# Patient Record
Sex: Male | Born: 1990 | Race: White | Hispanic: No | Marital: Single | Smoking: Current every day smoker
Health system: Southern US, Community
[De-identification: ages and names within clinical notes are randomized; demographics above are authoritative.]

## PROBLEM LIST (undated history)

## (undated) DIAGNOSIS — I10 Essential (primary) hypertension: Secondary | ICD-10-CM

---
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
## 2013-04-24 ENCOUNTER — Emergency Department (HOSPITAL_COMMUNITY): Payer: Self-pay

## 2013-04-24 ENCOUNTER — Emergency Department (HOSPITAL_COMMUNITY)
Admission: EM | Admit: 2013-04-24 | Discharge: 2013-04-24 | Disposition: A | Discharge status: Home / Self Care | Payer: Self-pay | Attending: Emergency Medicine | Admitting: Emergency Medicine

## 2013-04-24 DIAGNOSIS — Z23 Encounter for immunization: Secondary | ICD-10-CM | POA: Insufficient documentation

## 2013-04-24 DIAGNOSIS — S0990XA Unspecified injury of head, initial encounter: Secondary | ICD-10-CM | POA: Insufficient documentation

## 2013-04-24 DIAGNOSIS — S0100XA Unspecified open wound of scalp, initial encounter: Secondary | ICD-10-CM | POA: Insufficient documentation

## 2013-04-24 DIAGNOSIS — S0101XA Laceration without foreign body of scalp, initial encounter: Secondary | ICD-10-CM

## 2013-04-24 LAB — BASIC METABOLIC PANEL
BUN: 13 mg/dL (ref 6–23)
Creatinine, Ser: 1.02 mg/dL (ref 0.50–1.35)
GFR calc Af Amer: >90 mL/min (ref >90)
GFR calc non Af Amer: >90 mL/min (ref >90)
Glucose, Bld: 92 mg/dL (ref 70–99)
Potassium: 3.4 mEq/L — ABNORMAL LOW (ref 3.5–5.1)

## 2013-04-24 LAB — CBC WITH DIFFERENTIAL/PLATELET
Basophils Relative: 1 % (ref 0–1)
Eosinophils Absolute: 0.1 10*3/uL (ref 0.0–0.7)
Eosinophils Relative: 1 % (ref 0–5)
MCH: 29.5 pg (ref 26.0–34.0)
MCHC: 35.4 g/dL (ref 30.0–36.0)
MCV: 83.2 fL (ref 78.0–100.0)
Neutrophils Relative %: 68 % (ref 43–77)
Platelets: 254 10*3/uL (ref 150–400)
RBC: 4.95 MIL/uL (ref 4.22–5.81)
RDW: 12.5 % (ref 11.5–15.5)

## 2013-04-24 LAB — TYPE AND SCREEN
ABO/RH(D): O POS
Antibody Screen: NEGATIVE

## 2013-04-24 MED ORDER — TETANUS-DIPHTH-ACELL PERTUSSIS 5-2.5-18.5 LF-MCG/0.5 IM SUSP
0.5000 mL | Freq: Once | INTRAMUSCULAR | Status: AC
  Administered 2013-04-24: 0.5 mL via INTRAMUSCULAR

## 2013-04-24 MED ORDER — TETANUS-DIPHTHERIA TOXOIDS TD 5-2 LFU IM INJ
0.5000 mL | INJECTION | Freq: Once | INTRAMUSCULAR | Status: DC
  Filled 2013-04-24: qty 0.5

## 2013-04-24 MED ORDER — SODIUM CHLORIDE 0.9 % IV SOLN
1000.0000 mL | Freq: Once | INTRAVENOUS | Status: AC
  Administered 2013-04-24: 1000 mL via INTRAVENOUS
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
## 2013-04-24 NOTE — ED Notes (Signed)
MD at bedside. 
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
## 2013-05-02 ENCOUNTER — Emergency Department (HOSPITAL_COMMUNITY)
Admission: EM | Admit: 2013-05-02 | Discharge: 2013-05-02 | Disposition: A | Discharge status: Home / Self Care | Payer: Self-pay | Attending: Emergency Medicine | Admitting: Emergency Medicine

## 2013-05-02 ENCOUNTER — Encounter (HOSPITAL_COMMUNITY): Payer: Self-pay | Admitting: *Deleted

## 2013-05-02 DIAGNOSIS — R51 Headache: Secondary | ICD-10-CM | POA: Insufficient documentation

## 2013-05-02 DIAGNOSIS — R110 Nausea: Secondary | ICD-10-CM | POA: Insufficient documentation

## 2013-05-02 DIAGNOSIS — F0781 Postconcussional syndrome: Secondary | ICD-10-CM | POA: Insufficient documentation

## 2013-05-02 DIAGNOSIS — IMO0001 Reserved for inherently not codable concepts without codable children: Secondary | ICD-10-CM | POA: Insufficient documentation

## 2013-05-02 DIAGNOSIS — R42 Dizziness and giddiness: Secondary | ICD-10-CM | POA: Insufficient documentation

## 2013-05-02 DIAGNOSIS — R109 Unspecified abdominal pain: Secondary | ICD-10-CM | POA: Insufficient documentation

## 2013-05-02 DIAGNOSIS — Z202 Contact with and (suspected) exposure to infections with a predominantly sexual mode of transmission: Secondary | ICD-10-CM | POA: Insufficient documentation

## 2013-05-02 DIAGNOSIS — F17200 Nicotine dependence, unspecified, uncomplicated: Secondary | ICD-10-CM | POA: Insufficient documentation

## 2013-05-02 MED ORDER — CEFTRIAXONE SODIUM 250 MG IJ SOLR
250.0000 mg | Freq: Once | INTRAMUSCULAR | Status: AC
  Administered 2013-05-02: 250 mg via INTRAMUSCULAR
  Filled 2013-05-02: qty 250

## 2013-05-02 MED ORDER — AZITHROMYCIN 250 MG PO TABS
1000.0000 mg | ORAL_TABLET | Freq: Once | ORAL | Status: AC
  Administered 2013-05-02: 1000 mg via ORAL
  Filled 2013-05-02: qty 4

## 2013-05-02 MED ORDER — IBUPROFEN 400 MG PO TABS
800.0000 mg | ORAL_TABLET | Freq: Once | ORAL | Status: AC
  Administered 2013-05-02: 800 mg via ORAL
  Filled 2013-05-02: qty 2

## 2013-05-02 MED ORDER — LIDOCAINE HCL (PF) 1 % IJ SOLN
INTRAMUSCULAR | Status: AC
  Administered 2013-05-02: 2.1 mL
  Filled 2013-05-02: qty 5

## 2013-05-02 MED ORDER — IBUPROFEN 600 MG PO TABS: 600.0000 mg | ORAL_TABLET | Freq: Four times a day (QID) | ORAL | Status: DC | PRN
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
## 2013-05-03 NOTE — ED Provider Notes (Signed)
Medical screening examination/treatment/procedure(s) were performed by non-physician practitioner and as supervising physician I was immediately available for consultation/collaboration.  Cristina Mattern L Shaquanna Lycan, MD 05/03/13 0016 

## 2015-08-14 ENCOUNTER — Encounter (HOSPITAL_COMMUNITY): Payer: Self-pay | Admitting: Emergency Medicine

## 2015-08-14 DIAGNOSIS — L02414 Cutaneous abscess of left upper limb: Secondary | ICD-10-CM | POA: Insufficient documentation

## 2015-08-14 DIAGNOSIS — F17200 Nicotine dependence, unspecified, uncomplicated: Secondary | ICD-10-CM | POA: Insufficient documentation

## 2015-08-14 MED ORDER — OXYCODONE-ACETAMINOPHEN 5-325 MG PO TABS
ORAL_TABLET | ORAL | Status: AC
  Filled 2015-08-14: qty 1

## 2015-08-14 MED ORDER — OXYCODONE-ACETAMINOPHEN 5-325 MG PO TABS
1.0000 | ORAL_TABLET | Freq: Once | ORAL | Status: AC
  Administered 2015-08-14: 1 via ORAL

## 2015-08-14 NOTE — ED Notes (Addendum)
C/o abscess to L shoulder x 2 days with greenish- white discharge.  Pt reports ? Spider bite.  Also reports he felt like he had a fever yesterday.

## 2015-08-15 ENCOUNTER — Emergency Department (HOSPITAL_COMMUNITY)
Admission: EM | Admit: 2015-08-15 | Discharge: 2015-08-15 | Disposition: A | Discharge status: Home / Self Care | Payer: Self-pay | Attending: Emergency Medicine | Admitting: Emergency Medicine

## 2015-08-15 DIAGNOSIS — L0291 Cutaneous abscess, unspecified: Secondary | ICD-10-CM

## 2015-08-15 MED ORDER — LIDOCAINE HCL (PF) 1 % IJ SOLN: 2.0000 mL | Freq: Once | INTRAMUSCULAR | Status: DC

## 2015-08-15 MED ORDER — IBUPROFEN 600 MG PO TABS: 600.0000 mg | ORAL_TABLET | Freq: Four times a day (QID) | ORAL | Status: DC | PRN

## 2015-08-15 MED ORDER — SULFAMETHOXAZOLE-TRIMETHOPRIM 800-160 MG PO TABS: 1.0000 | ORAL_TABLET | Freq: Two times a day (BID) | ORAL | Status: AC

## 2015-08-15 MED ORDER — HYDROCODONE-ACETAMINOPHEN 5-325 MG PO TABS: 1.0000 | ORAL_TABLET | Freq: Four times a day (QID) | ORAL | Status: DC | PRN

## 2015-08-15 NOTE — ED Provider Notes (Addendum)
CSN: [id number]     Arrival date & time 08/14/15  2119 History   First MD Initiated Contact with Patient 08/15/15 0008     Chief Complaint  Patient presents with  . Abscess     (Consider location/radiation/quality/duration/timing/severity/associated sxs/prior Treatment) HPI Comments: Abscess L shoulder for the past 3 days that spontaneously open and started draining 2 days ago has been intermittently takin BC powers for pain. Now states has pain with arm movement .   Patient is a 24 y.o. male presenting with abscess. The history is provided by the patient.  Abscess Location:  Shoulder/arm Shoulder/arm abscess location:  L shoulder Abscess quality: draining, painful and redness   Abscess quality: no fluctuance and not weeping   Red streaking: no   Duration:  3 days Progression:  Unchanged Pain details:    Quality:  Pressure   Severity:  Moderate   Duration:  3 days   Timing:  Constant   Progression:  Unchanged Chronicity:  New Relieved by:  None tried Exacerbated by: palpation  Ineffective treatments:  None tried Associated symptoms: no fever     History reviewed. No pertinent past medical history. History reviewed. No pertinent past surgical history. No family history on file. Social History  Substance Use Topics  . Smoking status: Current Every Day Smoker  . Smokeless tobacco: None  . Alcohol Use: No    Review of Systems  Constitutional: Negative for fever and chills.  Skin: Positive for wound.  Neurological: Negative for numbness.  All other systems reviewed and are negative.     Allergies  Shrimp and Shrimp  Home Medications   Prior to Admission medications   Medication Sig Start Date End Date Taking? Authorizing Provider  HYDROcodone-acetaminophen (NORCO/VICODIN) 5-325 MG tablet Take 1 tablet by mouth every 6 (six) hours as needed for severe pain. 08/15/15   Earley FavorGail Luevenia Mcavoy, NP  ibuprofen (ADVIL,MOTRIN) 600 MG tablet Take 1 tablet (600 mg total) by mouth  every 6 (six) hours as needed. 08/15/15   Earley FavorGail Ben Habermann, NP   BP 134/73 mmHg  Pulse 85  Temp(Src) 98.7 F (37.1 C) (Oral)  Resp 16  Ht 5\' 7"  (1.702 m)  Wt 78.926 kg  BMI 27.25 kg/m2  SpO2 100% Physical Exam  Constitutional: He appears well-developed and well-nourished.  HENT:  Head: Normocephalic.  Eyes: Pupils are equal, round, and reactive to light.  Neck: Normal range of motion.  Cardiovascular: Normal rate.   Pulmonary/Chest: Effort normal.  Abdominal: Soft.  Musculoskeletal: Normal range of motion. He exhibits tenderness. He exhibits no edema.  Lymphadenopathy:    He has axillary adenopathy.       Left axillary: Pectoral adenopathy present.  Neurological: He is alert.  Skin: There is erythema.     Nursing note and vitals reviewed.   ED Course  Procedures (including critical care time) Labs Review Labs Reviewed - No data to display  Imaging Review No results found. I have personally reviewed and evaluated these images and lab results as part of my medical decision-making.   EKG Interpretation None     Will start PO antibiotics, warm compresses and pain control  Abscess not drained MDM   Final diagnoses:  Abscess         Earley FavorGail Lakiah Dhingra, NP 09/03/15 1958  Earley FavorGail Areg Bialas, NP 09/03/15 2159  Lyndal Pulleyaniel Knott, MD 09/04/15 0701  Earley FavorGail Jaziel Bennett, NP 11/25/15 2017  Lyndal Pulleyaniel Knott, MD 11/26/15 [phone]

## 2015-08-15 NOTE — Discharge Instructions (Signed)
Abscess An abscess (boil or furuncle) is an infected area on or under the skin. This area is filled with yellowish-white fluid (pus) and other material (debris). HOME CARE   Only take medicines as told by your doctor.  If you were given antibiotic medicine, take it as directed. Finish the medicine even if you start to feel better.  If gauze is used, follow your doctor's directions for changing the gauze.  To avoid spreading the infection:  Keep your abscess covered with a bandage.  Wash your hands well.  Do not share personal care items, towels, or whirlpools with others.  Avoid skin contact with others.  Keep your skin and clothes clean around the abscess.  Keep all doctor visits as told. GET HELP RIGHT AWAY IF:   You have more pain, puffiness (swelling), or redness in the wound site.  You have more fluid or blood coming from the wound site.  You have muscle aches, chills, or you feel sick.  You have a fever. MAKE SURE YOU:   Understand these instructions.  Will watch your condition.  Will get help right away if you are not doing well or get worse.   This information is not intended to replace advice given to you by your health care provider. Make sure you discuss any questions you have with your health care provider.   Document Released: 03/19/2008 Document Revised: 04/01/2012 Document Reviewed: 12/15/2011 Elsevier Interactive Patient Education 2016 [employer] Take the antibiotic as directed until all tablet taken use the Ibuprofen for moderate pain and Hydrocodone for sever pain

## 2015-12-12 ENCOUNTER — Emergency Department (HOSPITAL_COMMUNITY)
Admission: EM | Admit: 2015-12-12 | Discharge: 2015-12-13 | Disposition: A | Discharge status: Home / Self Care | Payer: Medicaid Other | Attending: Emergency Medicine | Admitting: Emergency Medicine

## 2015-12-12 ENCOUNTER — Encounter (HOSPITAL_COMMUNITY): Payer: Self-pay | Admitting: *Deleted

## 2015-12-12 DIAGNOSIS — L02416 Cutaneous abscess of left lower limb: Secondary | ICD-10-CM | POA: Diagnosis not present

## 2015-12-12 DIAGNOSIS — I10 Essential (primary) hypertension: Secondary | ICD-10-CM | POA: Insufficient documentation

## 2015-12-12 DIAGNOSIS — L03116 Cellulitis of left lower limb: Secondary | ICD-10-CM | POA: Diagnosis not present

## 2015-12-12 DIAGNOSIS — F17210 Nicotine dependence, cigarettes, uncomplicated: Secondary | ICD-10-CM | POA: Insufficient documentation

## 2015-12-12 HISTORY — DX: Essential (primary) hypertension: I10

## 2015-12-12 LAB — CBC WITH DIFFERENTIAL/PLATELET
Basophils Absolute: 0 K/uL (ref 0.0–0.1)
Basophils Relative: 0 %
Eosinophils Absolute: 0.2 K/uL (ref 0.0–0.7)
Eosinophils Relative: 2 %
HCT: 45.2 % (ref 39.0–52.0)
Hemoglobin: 15.8 g/dL (ref 13.0–17.0)
Lymphocytes Relative: 18 %
Lymphs Abs: 2.1 K/uL (ref 0.7–4.0)
MCH: 28.6 pg (ref 26.0–34.0)
MCHC: 35 g/dL (ref 30.0–36.0)
MCV: 81.7 fL (ref 78.0–100.0)
Monocytes Absolute: 0.5 K/uL (ref 0.1–1.0)
Monocytes Relative: 4 %
Neutro Abs: 8.8 K/uL — ABNORMAL HIGH (ref 1.7–7.7)
Neutrophils Relative %: 76 %
Platelets: 288 K/uL (ref 150–400)
RBC: 5.53 MIL/uL (ref 4.22–5.81)
RDW: 12.6 % (ref 11.5–15.5)
WBC: 11.6 K/uL — ABNORMAL HIGH (ref 4.0–10.5)

## 2015-12-12 LAB — COMPREHENSIVE METABOLIC PANEL WITH GFR
ALT: 23 U/L (ref 17–63)
AST: 23 U/L (ref 15–41)
Albumin: 4.1 g/dL (ref 3.5–5.0)
Alkaline Phosphatase: 82 U/L (ref 38–126)
Anion gap: 16 — ABNORMAL HIGH (ref 5–15)
BUN: 17 mg/dL (ref 6–20)
CO2: 22 mmol/L (ref 22–32)
Calcium: 9.5 mg/dL (ref 8.9–10.3)
Chloride: 104 mmol/L (ref 101–111)
Creatinine, Ser: 1.35 mg/dL — ABNORMAL HIGH (ref 0.61–1.24)
GFR calc Af Amer: >60 mL/min (ref >60)
GFR calc non Af Amer: >60 mL/min (ref >60)
Glucose, Bld: 103 mg/dL — ABNORMAL HIGH (ref 65–99)
Potassium: 4.1 mmol/L (ref 3.5–5.1)
Sodium: 142 mmol/L (ref 135–145)
Total Bilirubin: 0.4 mg/dL (ref 0.3–1.2)
Total Protein: 7.4 g/dL (ref 6.5–8.1)

## 2015-12-12 LAB — LACTIC ACID, PLASMA: Lactic Acid, Venous: 0.7 mmol/L (ref 0.5–2.0)

## 2015-12-12 MED ORDER — KETOROLAC TROMETHAMINE 30 MG/ML IJ SOLN
30.0000 mg | Freq: Once | INTRAMUSCULAR | Status: AC
  Administered 2015-12-12: 30 mg via INTRAVENOUS
  Filled 2015-12-12: qty 1

## 2015-12-12 MED ORDER — OXYCODONE-ACETAMINOPHEN 5-325 MG PO TABS
1.0000 | ORAL_TABLET | Freq: Once | ORAL | Status: AC
  Administered 2015-12-12: 1 via ORAL

## 2015-12-12 MED ORDER — HYDROCODONE-ACETAMINOPHEN 5-325 MG PO TABS: 1.0000 | ORAL_TABLET | ORAL | Status: DC | PRN

## 2015-12-12 MED ORDER — CLINDAMYCIN HCL 150 MG PO CAPS
300.0000 mg | ORAL_CAPSULE | Freq: Three times a day (TID) | ORAL | Status: DC
Start: 2015-12-12 — End: 2016-11-16

## 2015-12-12 MED ORDER — CLINDAMYCIN PHOSPHATE 900 MG/50ML IV SOLN
900.0000 mg | Freq: Once | INTRAVENOUS | Status: AC
  Administered 2015-12-12: 900 mg via INTRAVENOUS
  Filled 2015-12-12: qty 50

## 2015-12-12 MED ORDER — MORPHINE SULFATE (PF) 4 MG/ML IV SOLN
4.0000 mg | Freq: Once | INTRAVENOUS | Status: AC
  Administered 2015-12-12: 4 mg via INTRAVENOUS
  Filled 2015-12-12: qty 1

## 2015-12-12 MED ORDER — LIDOCAINE-EPINEPHRINE (PF) 2 %-1:200000 IJ SOLN
10.0000 mL | Freq: Once | INTRAMUSCULAR | Status: AC
  Administered 2015-12-12: 10 mL
  Filled 2015-12-12: qty 10

## 2015-12-12 MED ORDER — OXYCODONE-ACETAMINOPHEN 5-325 MG PO TABS
ORAL_TABLET | ORAL | Status: AC
  Filled 2015-12-12: qty 1

## 2015-12-12 MED ORDER — SODIUM CHLORIDE 0.9 % IV BOLUS (SEPSIS)
1000.0000 mL | Freq: Once | INTRAVENOUS | Status: AC
  Administered 2015-12-12: 1000 mL via INTRAVENOUS

## 2015-12-12 NOTE — Discharge Instructions (Signed)
Abscess °An abscess is an infected area that contains a collection of pus and debris. It can occur in almost any part of the body. An abscess is also known as a furuncle or boil. °CAUSES  °An abscess occurs when tissue gets infected. This can occur from blockage of oil or sweat glands, infection of hair follicles, or a minor injury to the skin. As the body tries to fight the infection, pus collects in the area and creates pressure under the skin. This pressure causes pain. People with weakened immune systems have difficulty fighting infections and get certain abscesses more often.  °SYMPTOMS °Usually an abscess develops on the skin and becomes a painful mass that is red, warm, and tender. If the abscess forms under the skin, you may feel a moveable soft area under the skin. Some abscesses break open (rupture) on their own, but most will continue to get worse without care. The infection can spread deeper into the body and eventually into the bloodstream, causing you to feel ill.  °DIAGNOSIS  °Your caregiver will take your medical history and perform a physical exam. A sample of fluid may also be taken from the abscess to determine what is causing your infection. °TREATMENT  °Your caregiver may prescribe antibiotic medicines to fight the infection. However, taking antibiotics alone usually does not cure an abscess. Your caregiver may need to make a small cut (incision) in the abscess to drain the pus. In some cases, gauze is packed into the abscess to reduce pain and to continue draining the area. °HOME CARE INSTRUCTIONS  °· Only take over-the-counter or prescription medicines for pain, discomfort, or fever as directed by your caregiver. °· If you were prescribed antibiotics, take them as directed. Finish them even if you start to feel better. °· If gauze is used, follow your caregiver's directions for changing the gauze. °· To avoid spreading the infection: °· Keep your draining abscess covered with a  bandage. °· Wash your hands well. °· Do not share personal care items, towels, or whirlpools with others. °· Avoid skin contact with others. °· Keep your skin and clothes clean around the abscess. °· Keep all follow-up appointments as directed by your caregiver. °SEEK MEDICAL CARE IF:  °· You have increased pain, swelling, redness, fluid drainage, or bleeding. °· You have muscle aches, chills, or a general ill feeling. °· You have a fever. °MAKE SURE YOU:  °· Understand these instructions. °· Will watch your condition. °· Will get help right away if you are not doing well or get worse. °  °This information is not intended to replace advice given to you by your health care provider. Make sure you discuss any questions you have with your health care provider. °  °Document Released: 07/11/2005 Document Revised: 04/01/2012 Document Reviewed: 12/14/2011 °Elsevier Interactive Patient Education ©2016 [employer] ° °Cellulitis °Cellulitis is an infection of the skin and the tissue beneath it. The infected area is usually red and tender. Cellulitis occurs most often in the arms and lower legs.  °CAUSES  °Cellulitis is caused by bacteria that enter the skin through cracks or cuts in the skin. The most common types of bacteria that cause cellulitis are staphylococci and streptococci. °SIGNS AND SYMPTOMS  °· Redness and warmth. °· Swelling. °· Tenderness or pain. °· Fever. °DIAGNOSIS  °Your health care provider can usually determine what is wrong based on a physical exam. Blood tests may also be done. °TREATMENT  °Treatment usually involves taking an antibiotic medicine. °HOME CARE INSTRUCTIONS  °·   Take your antibiotic medicine as directed by your health care provider. Finish the antibiotic even if you start to feel better. °· Keep the infected arm or leg elevated to reduce swelling. °· Apply a warm cloth to the affected area up to 4 times per day to relieve pain. °· Take medicines only as directed by your health care  provider. °· Keep all follow-up visits as directed by your health care provider. °SEEK MEDICAL CARE IF:  °· You notice red streaks coming from the infected area. °· Your red area gets larger or turns dark in color. °· Your bone or joint underneath the infected area becomes painful after the skin has healed. °· Your infection returns in the same area or another area. °· You notice a swollen bump in the infected area. °· You develop new symptoms. °· You have a fever. °SEEK IMMEDIATE MEDICAL CARE IF:  °· You feel very sleepy. °· You develop vomiting or diarrhea. °· You have a general ill feeling (malaise) with muscle aches and pains. °  °This information is not intended to replace advice given to you by your health care provider. Make sure you discuss any questions you have with your health care provider. °  °Document Released: 07/11/2005 Document Revised: 06/22/2015 Document Reviewed: 12/17/2011 °Elsevier Interactive Patient Education ©2016 [employer] ° °Incision and Drainage °Incision and drainage is a procedure in which a sac-like structure (cystic structure) is opened and drained. The area to be drained usually contains material such as pus, fluid, or blood.  °LET YOUR CAREGIVER KNOW ABOUT:  °· Allergies to medicine. °· Medicines taken, including vitamins, herbs, eyedrops, over-the-counter medicines, and creams. °· Use of steroids (by mouth or creams). °· Previous problems with anesthetics or numbing medicines. °· History of bleeding problems or blood clots. °· Previous surgery. °· Other health problems, including diabetes and kidney problems. °· Possibility of pregnancy, if this applies. °RISKS AND COMPLICATIONS °· Pain. °· Bleeding. °· Scarring. °· Infection. °BEFORE THE PROCEDURE  °You may need to have an ultrasound or other imaging tests to see how large or deep your cystic structure is. Blood tests may also be used to determine if you have an infection or how severe the infection is. You may need to have a  tetanus shot. °PROCEDURE  °The affected area is cleaned with a cleaning fluid. The cyst area will then be numbed with a medicine (local anesthetic). A small incision will be made in the cystic structure. A syringe or catheter may be used to drain the contents of the cystic structure, or the contents may be squeezed out. The area will then be flushed with a cleansing solution. After cleansing the area, it is often gently packed with a gauze or another wound dressing. Once it is packed, it will be covered with gauze and tape or some other type of wound dressing.  °AFTER THE PROCEDURE  °· Often, you will be allowed to go home right after the procedure. °· You may be given antibiotic medicine to prevent or heal an infection. °· If the area was packed with gauze or some other wound dressing, you will likely need to come back in 1 to 2 days to get it removed. °· The area should heal in about 14 days. °  °This information is not intended to replace advice given to you by your health care provider. Make sure you discuss any questions you have with your health care provider. °  °Document Released: 03/27/2001 Document Revised: 04/01/2012 Document Reviewed: 11/26/2011 °  Elsevier Interactive Patient Education ©2016 [employer] Inc. ° °

## 2015-12-12 NOTE — ED Provider Notes (Signed)
CSN: [id number]     Arrival date & time 12/12/15  1725 History   First MD Initiated Contact with Patient 12/12/15 2034     Chief Complaint  Patient presents with  . Abscess     (Consider location/radiation/quality/duration/timing/severity/associated sxs/prior Treatment) Patient is a 25 y.o. male presenting with abscess. The history is provided by the patient and the spouse. No language interpreter was used.  Abscess Location:  Leg Leg abscess location:  L lower leg Abscess quality: itching, painful, redness and warmth   Abscess quality: not draining   Red streaking: yes   Duration:  5 days Progression:  Worsening Associated symptoms: no fever, no nausea and no vomiting   Associated symptoms comment:  Patient with a history of boils presents with progressive swelling and pain in left anterior lower leg, with redness now extending to distal portion of leg. He reports generalized body aches x 2 days. No known fever.    Past Medical History  Diagnosis Date  . Hypertension    History reviewed. No pertinent past surgical history. No family history on file. Social History  Substance Use Topics  . Smoking status: Current Every Day Smoker -- 0.50 packs/day    Types: Cigarettes  . Smokeless tobacco: None  . Alcohol Use: No    Review of Systems  Constitutional: Positive for chills. Negative for fever.  Respiratory: Negative for cough and shortness of breath.   Gastrointestinal: Negative for nausea and vomiting.  Musculoskeletal: Positive for myalgias.       See HPI.  Skin: Positive for color change and wound.  Neurological: Negative for numbness.      Allergies  Shrimp  Home Medications   Prior to Admission medications   Medication Sig Start Date End Date Taking? Authorizing Provider  clindamycin (CLEOCIN) 150 MG capsule Take 2 capsules (300 mg total) by mouth 3 (three) times daily. 12/12/15   Elpidio Anis, PA-C  HYDROcodone-acetaminophen (NORCO/VICODIN) 5-325 MG  tablet Take 1-2 tablets by mouth every 4 (four) hours as needed. 12/12/15   Elpidio Anis, PA-C  ibuprofen (ADVIL,MOTRIN) 600 MG tablet Take 1 tablet (600 mg total) by mouth every 6 (six) hours as needed. Patient not taking: Reported on 12/12/2015 08/15/15   Earley Favor, NP   BP 139/88 mmHg  Pulse 93  Temp(Src) 99.1 F (37.3 C) (Oral)  Resp 16  Ht  (1.651 m)  Wt 77.111 kg  BMI 28.29 kg/m2  SpO2 99% Physical Exam  Constitutional: He is oriented to person, place, and time. He appears well-developed and well-nourished. No distress.  Cardiovascular: Intact distal pulses.   Pulmonary/Chest: Effort normal.  Musculoskeletal:  Left anterior lower leg erythematous, warm to touch. There is a large raised significantly red area to proximal lower leg c/w abscess with central ulceration but no active drainage. No redness that extends proximally. Left lower leg moderately swollen.   Neurological: He is alert and oriented to person, place, and time.  Skin: There is erythema.  Psychiatric: He has a normal mood and affect.    ED Course  Procedures (including critical care time) Labs Review Labs Reviewed  COMPREHENSIVE METABOLIC PANEL - Abnormal; Notable for the following:    Glucose, Bld 103 (*)    Creatinine, Ser 1.35 (*)    Anion gap 16 (*)    All other components within normal limits  CBC WITH DIFFERENTIAL/PLATELET - Abnormal; Notable for the following:    WBC 11.6 (*)    Neutro Abs 8.8 (*)    All  other components within normal limits  LACTIC ACID, PLASMA  LACTIC ACID, PLASMA   Results for orders placed or performed during the hospital encounter of 12/12/15  Comprehensive metabolic panel  Result Value Ref Range   Sodium 142 135 - 145 mmol/L   Potassium 4.1 3.5 - 5.1 mmol/L   Chloride 104 101 - 111 mmol/L   CO2 22 22 - 32 mmol/L   Glucose, Bld 103 (H) 65 - 99 mg/dL   BUN 17 6 - 20 mg/dL   Creatinine, Ser 4.54 (H) 0.61 - 1.24 mg/dL   Calcium 9.5 8.9 - 09.8 mg/dL   Total Protein  7.4 6.5 - 8.1 g/dL   Albumin 4.1 3.5 - 5.0 g/dL   AST 23 15 - 41 U/L   ALT 23 17 - 63 U/L   Alkaline Phosphatase 82 38 - 126 U/L   Total Bilirubin 0.4 0.3 - 1.2 mg/dL   GFR calc non Af Amer >60 >60 mL/min   GFR calc Af Amer >60 >60 mL/min   Anion gap 16 (H) 5 - 15  CBC with Differential  Result Value Ref Range   WBC 11.6 (H) 4.0 - 10.5 K/uL   RBC 5.53 4.22 - 5.81 MIL/uL   Hemoglobin 15.8 13.0 - 17.0 g/dL   HCT 11.9 14.7 - 82.9 %   MCV 81.7 78.0 - 100.0 fL   MCH 28.6 26.0 - 34.0 pg   MCHC 35.0 30.0 - 36.0 g/dL   RDW 56.2 13.0 - 86.5 %   Platelets 288 150 - 400 K/uL   Neutrophils Relative % 76 %   Neutro Abs 8.8 (H) 1.7 - 7.7 K/uL   Lymphocytes Relative 18 %   Lymphs Abs 2.1 0.7 - 4.0 K/uL   Monocytes Relative 4 %   Monocytes Absolute 0.5 0.1 - 1.0 K/uL   Eosinophils Relative 2 %   Eosinophils Absolute 0.2 0.0 - 0.7 K/uL   Basophils Relative 0 %   Basophils Absolute 0.0 0.0 - 0.1 K/uL  Lactic acid, plasma  Result Value Ref Range   Lactic Acid, Venous 0.7 0.5 - 2.0 mmol/L   INCISION AND DRAINAGE Performed by: Elpidio Anis A Consent: Verbal consent obtained. Risks and benefits: risks, benefits and alternatives were discussed Type: abscess  Body area: left lower leg, anterior  Anesthesia: local infiltration  Incision was made with a scalpel.  Local anesthetic: lidocaine 2% w/epinephrine  Anesthetic total: 3 ml  Complexity: complex Blunt dissection to break up loculations  Drainage: purulent  Drainage amount: moderate  Packing material: none  Patient tolerance: Patient tolerated the procedure well with no immediate complications.     Imaging Review No results found. I have personally reviewed and evaluated these images and lab results as part of my medical decision-making.   EKG Interpretation None      MDM   Final diagnoses:  Abscess of left leg  Cellulitis of left anterior lower leg    1. Left leg abscess 2. Left leg cellulitis  Patient  with abscess on left leg x 5 days, now with swelling, redness, body aches prompting ED visit. Lab studies reassuring. Lactic Acid normal. He received IV clinda and will be able to discharge home with PO Clinda. Encouraged 2 day recheck here as he has no primary care provider.     Elpidio Anis, PA-C 12/12/15 7846  Pricilla Loveless, MD 12/13/15 [phone]

## 2015-12-12 NOTE — ED Notes (Addendum)
Pt has abscess to L shin x 6 days.  States now pain travelling to L thigh.  C/o whole body aches and fevers at home.  No redness noted to L thigh, though tenderness on palpation.

## 2016-11-16 ENCOUNTER — Emergency Department (HOSPITAL_COMMUNITY)
Admission: EM | Admit: 2016-11-16 | Discharge: 2016-11-16 | Disposition: A | Discharge status: Home / Self Care | Payer: Medicaid Other | Attending: Emergency Medicine | Admitting: Emergency Medicine

## 2016-11-16 ENCOUNTER — Encounter (HOSPITAL_COMMUNITY): Payer: Self-pay | Admitting: Emergency Medicine

## 2016-11-16 DIAGNOSIS — Y93G9 Activity, other involving cooking and grilling: Secondary | ICD-10-CM | POA: Insufficient documentation

## 2016-11-16 DIAGNOSIS — Y929 Unspecified place or not applicable: Secondary | ICD-10-CM | POA: Insufficient documentation

## 2016-11-16 DIAGNOSIS — X102XXA Contact with fats and cooking oils, initial encounter: Secondary | ICD-10-CM | POA: Insufficient documentation

## 2016-11-16 DIAGNOSIS — Y999 Unspecified external cause status: Secondary | ICD-10-CM | POA: Insufficient documentation

## 2016-11-16 DIAGNOSIS — T25221A Burn of second degree of right foot, initial encounter: Secondary | ICD-10-CM | POA: Diagnosis not present

## 2016-11-16 DIAGNOSIS — Z23 Encounter for immunization: Secondary | ICD-10-CM | POA: Diagnosis not present

## 2016-11-16 DIAGNOSIS — I10 Essential (primary) hypertension: Secondary | ICD-10-CM | POA: Insufficient documentation

## 2016-11-16 DIAGNOSIS — F17210 Nicotine dependence, cigarettes, uncomplicated: Secondary | ICD-10-CM | POA: Diagnosis not present

## 2016-11-16 DIAGNOSIS — Z79899 Other long term (current) drug therapy: Secondary | ICD-10-CM | POA: Insufficient documentation

## 2016-11-16 DIAGNOSIS — T25021A Burn of unspecified degree of right foot, initial encounter: Secondary | ICD-10-CM | POA: Diagnosis present

## 2016-11-16 MED ORDER — TETANUS-DIPHTH-ACELL PERTUSSIS 5-2.5-18.5 LF-MCG/0.5 IM SUSP
0.5000 mL | Freq: Once | INTRAMUSCULAR | Status: AC
  Administered 2016-11-16: 0.5 mL via INTRAMUSCULAR
  Filled 2016-11-16: qty 0.5

## 2016-11-16 MED ORDER — CEPHALEXIN 500 MG PO CAPS: 1000.0000 mg | ORAL_CAPSULE | Freq: Two times a day (BID) | ORAL | 0 refills | Status: DC

## 2016-11-16 MED ORDER — OXYCODONE-ACETAMINOPHEN 5-325 MG PO TABS
ORAL_TABLET | ORAL | Status: AC
  Administered 2016-11-16: 1 via ORAL
  Filled 2016-11-16: qty 1

## 2016-11-16 MED ORDER — OXYCODONE-ACETAMINOPHEN 5-325 MG PO TABS
1.0000 | ORAL_TABLET | ORAL | Status: AC | PRN
  Administered 2016-11-16 (×2): 1 via ORAL
  Filled 2016-11-16 (×2): qty 1

## 2016-11-16 MED ORDER — CEPHALEXIN 250 MG PO CAPS
1000.0000 mg | ORAL_CAPSULE | Freq: Once | ORAL | Status: AC
  Administered 2016-11-16: 1000 mg via ORAL
  Filled 2016-11-16: qty 4

## 2016-11-16 MED ORDER — OXYCODONE-ACETAMINOPHEN 5-325 MG PO TABS: 1.0000 | ORAL_TABLET | Freq: Four times a day (QID) | ORAL | 0 refills | Status: DC | PRN

## 2016-11-16 MED ORDER — IBUPROFEN 600 MG PO TABS: 600.0000 mg | ORAL_TABLET | Freq: Four times a day (QID) | ORAL | 0 refills | Status: DC | PRN

## 2016-11-16 MED ORDER — IBUPROFEN 800 MG PO TABS
800.0000 mg | ORAL_TABLET | Freq: Once | ORAL | Status: AC
  Administered 2016-11-16: 800 mg via ORAL
  Filled 2016-11-16: qty 1

## 2016-11-16 NOTE — ED Provider Notes (Signed)
[facility]-EMERGENCY DEPT Provider Note   CSN: [id number] Arrival date & time: 11/16/16  2003  By signing my name below, I, Rosario Adie, attest that this documentation has been prepared under the direction and in the presence of Fayrene Helper, PA-C.  Electronically Signed: Rosario Adie, ED Scribe. 11/16/16. 9:07 PM.  History   Chief Complaint Chief Complaint  Patient presents with  . Burn   The history is provided by the patient. No language interpreter was used.    HPI Comments: Cameron Fuentes is a 26 y.o. male with a h/o HTN, who presents to the Emergency Department complaining of sudden onset, gradually worsening area of pain, redness, and swelling sustained to the right foot and right lower leg s/p burn which occurred five days ago. Pt reports that he was cooking when a pot of hot oil spilled onto his right lower leg and foot. He notes associated numbness to the dorsum of the right foot through the distribution over the right 3-5th toes. Pt has been managing the area locally at home by wrapping the area and applying antibiotic ointments at home, but without significant relief. His pain is mildly alleviated with slight pressure to the foot, but he notes that complete weight bearing and ambulation significantly exacerbates his pain. He denies fever, or any other associated symptoms.  Past Medical History:  Diagnosis Date  . Hypertension    There are no active problems to display for this patient.  History reviewed. No pertinent surgical history.  Home Medications    Prior to Admission medications   Medication Sig Start Date End Date Taking? Authorizing Provider  clindamycin (CLEOCIN) 150 MG capsule Take 2 capsules (300 mg total) by mouth 3 (three) times daily. 12/12/15   Elpidio Anis, PA-C  HYDROcodone-acetaminophen (NORCO/VICODIN) 5-325 MG tablet Take 1-2 tablets by mouth every 4 (four) hours as needed. 12/12/15   Elpidio Anis, PA-C  ibuprofen (ADVIL,MOTRIN) 600 MG tablet  Take 1 tablet (600 mg total) by mouth every 6 (six) hours as needed. Patient not taking: Reported on 12/12/2015 08/15/15   Earley Favor, NP   Family History History reviewed. No pertinent family history.  Social History Social History  Substance Use Topics  . Smoking status: Current Every Day Smoker    Packs/day: 0.50    Types: Cigarettes  . Smokeless tobacco: Never Used  . Alcohol use No   Allergies   Shrimp [shellfish allergy]  Review of Systems Review of Systems  Constitutional: Negative for fever.  Musculoskeletal: Positive for myalgias.  Skin: Positive for color change and wound.  Neurological: Positive for numbness.  All other systems reviewed and are negative.  Physical Exam Updated Vital Signs BP (!) 153/101 (BP Location: Left Arm)   Pulse 91   Temp 97.4 F (36.3 C) (Oral)   Resp 18   SpO2 97%   Physical Exam  Constitutional: He appears well-developed and well-nourished. No distress.  HENT:  Head: Normocephalic and atraumatic.  Eyes: Conjunctivae are normal.  Neck: Normal range of motion.  Cardiovascular: Normal rate.   Pulmonary/Chest: Effort normal.  Abdominal: He exhibits no distension.  Musculoskeletal: Normal range of motion. He exhibits edema and tenderness.  Neurological: He is alert.  Skin: Skin is warm. Capillary refill takes less than 2 seconds. There is erythema. No pallor.  RLE: 1% body surface area second degree burn noted to the anterior aspects of the lower extremity. Partial thickness second degree burn noted to the dorsum of the right foot approximately 5x3cm with surrounding skin  erythema and tenderness. Associated edema is noted. Sensation is mildly decreased to the third, fourth, and fifth toes, but with brisk capillary refill.    Psychiatric: He has a normal mood and affect. His behavior is normal.  Nursing note and vitals reviewed.  ED Treatments / Results  DIAGNOSTIC STUDIES: Oxygen Saturation is 97% on RA, normal by my  interpretation.   COORDINATION OF CARE: 9:04 PM-Discussed next steps with pt. Pt verbalized understanding and is agreeable with the plan.   Labs (all labs ordered are listed, but only abnormal results are displayed) Labs Reviewed - No data to display  EKG  EKG Interpretation None      Radiology No results found.  Procedures Procedures   Medications Ordered in ED Medications  oxyCODONE-acetaminophen (PERCOCET/ROXICET) 5-325 MG per tablet 1 tablet (1 tablet Oral Given 11/16/16 2019)  oxyCODONE-acetaminophen (PERCOCET/ROXICET) 5-325 MG per tablet (not administered)   Initial Impression / Assessment and Plan / ED Course  I have reviewed the triage vital signs and the nursing notes.  Pertinent labs & imaging results that were available during my care of the patient were reviewed by me and considered in my medical decision making (see chart for details).     BP (!) 153/101 (BP Location: Left Arm)   Pulse 91   Temp 97.4 F (36.3 C) (Oral)   Resp 18   SpO2 97%    Final Clinical Impressions(s) / ED Diagnoses   Final diagnoses:  Second degree burn of right foot, initial encounter   New Prescriptions New Prescriptions   CEPHALEXIN (KEFLEX) 500 MG CAPSULE    Take 2 capsules (1,000 mg total) by mouth 2 (two) times daily.   OXYCODONE-ACETAMINOPHEN (PERCOCET/ROXICET) 5-325 MG TABLET    Take 1 tablet by mouth every 6 (six) hours as needed for severe pain.   I personally performed the services described in this documentation, which was scribed in my presence. The recorded information has been reviewed and is accurate.     9:13 PM Pt does have some signficant partial thickness 2nd degree burn to his RLE and dorsum of R foot with evidence of infection.  No hx of diabetes.  Will update Tdap.  ABX and pain medication prescribe.  Wound care instruction given. Encourage pt to f/u with plastic surgeon DR. Tribune CompanyClaire Sanger as needed.  Pt understand to return in 48 hrs for wound recheck if  no improvement.     Fayrene HelperBowie Taijah Macrae, PA-C 11/16/16 2115    Gwyneth SproutWhitney Plunkett, MD 11/16/16 [phone]

## 2016-11-16 NOTE — ED Notes (Signed)
Pt's foot soaking in 3 L NS at this time.  Will dress foot with bacitracin after soaking.

## 2016-11-16 NOTE — ED Triage Notes (Signed)
Pt presents with oil burn to RIGHT lower leg and foot; pt states happened on Sunday; was using cooking oil

## 2016-11-16 NOTE — Discharge Instructions (Signed)
Please cleanse wound and apply neosporin twice daily for the next 2 weeks.  Take antibiotic as prescribed.  Take pain medication as needed.  Keep foot elevated.  Follow up with specialist next week if you notice no improvement.

## 2017-03-07 ENCOUNTER — Emergency Department (HOSPITAL_COMMUNITY): Payer: Medicaid Other

## 2017-03-07 ENCOUNTER — Encounter (HOSPITAL_COMMUNITY): Payer: Self-pay | Admitting: Emergency Medicine

## 2017-03-07 ENCOUNTER — Emergency Department (HOSPITAL_COMMUNITY)
Admission: EM | Admit: 2017-03-07 | Discharge: 2017-03-07 | Disposition: A | Discharge status: Home / Self Care | Payer: Medicaid Other | Attending: Emergency Medicine | Admitting: Emergency Medicine

## 2017-03-07 DIAGNOSIS — Y999 Unspecified external cause status: Secondary | ICD-10-CM | POA: Diagnosis not present

## 2017-03-07 DIAGNOSIS — R58 Hemorrhage, not elsewhere classified: Secondary | ICD-10-CM | POA: Diagnosis not present

## 2017-03-07 DIAGNOSIS — R1012 Left upper quadrant pain: Secondary | ICD-10-CM | POA: Diagnosis not present

## 2017-03-07 DIAGNOSIS — S20212A Contusion of left front wall of thorax, initial encounter: Secondary | ICD-10-CM | POA: Diagnosis not present

## 2017-03-07 DIAGNOSIS — S299XXA Unspecified injury of thorax, initial encounter: Secondary | ICD-10-CM | POA: Diagnosis present

## 2017-03-07 DIAGNOSIS — W501XXA Accidental kick by another person, initial encounter: Secondary | ICD-10-CM | POA: Insufficient documentation

## 2017-03-07 DIAGNOSIS — S20219A Contusion of unspecified front wall of thorax, initial encounter: Secondary | ICD-10-CM

## 2017-03-07 DIAGNOSIS — N1330 Unspecified hydronephrosis: Secondary | ICD-10-CM | POA: Insufficient documentation

## 2017-03-07 DIAGNOSIS — F17210 Nicotine dependence, cigarettes, uncomplicated: Secondary | ICD-10-CM | POA: Insufficient documentation

## 2017-03-07 DIAGNOSIS — I10 Essential (primary) hypertension: Secondary | ICD-10-CM | POA: Insufficient documentation

## 2017-03-07 DIAGNOSIS — Y92322 Soccer field as the place of occurrence of the external cause: Secondary | ICD-10-CM | POA: Diagnosis not present

## 2017-03-07 DIAGNOSIS — Z79899 Other long term (current) drug therapy: Secondary | ICD-10-CM | POA: Diagnosis not present

## 2017-03-07 DIAGNOSIS — Y9366 Activity, soccer: Secondary | ICD-10-CM | POA: Diagnosis not present

## 2017-03-07 LAB — CBC WITH DIFFERENTIAL/PLATELET
Basophils Absolute: 0 10*3/uL (ref 0.0–0.1)
Basophils Relative: 0 %
Eosinophils Absolute: 0.1 10*3/uL (ref 0.0–0.7)
Eosinophils Relative: 2 %
HEMATOCRIT: 46.9 % (ref 39.0–52.0)
Hemoglobin: 16.5 g/dL (ref 13.0–17.0)
LYMPHS ABS: 1.1 10*3/uL (ref 0.7–4.0)
Lymphocytes Relative: 15 %
MCH: 29.4 pg (ref 26.0–34.0)
MCHC: 35.2 g/dL (ref 30.0–36.0)
MCV: 83.5 fL (ref 78.0–100.0)
MONO ABS: 0.6 10*3/uL (ref 0.1–1.0)
Monocytes Relative: 8 %
NEUTROS ABS: 5.3 10*3/uL (ref 1.7–7.7)
Neutrophils Relative %: 75 %
PLATELETS: 167 10*3/uL (ref 150–400)
RBC: 5.62 MIL/uL (ref 4.22–5.81)
RDW: 12.6 % (ref 11.5–15.5)
WBC: 7.1 10*3/uL (ref 4.0–10.5)

## 2017-03-07 LAB — COMPREHENSIVE METABOLIC PANEL
ALBUMIN: 4.2 g/dL (ref 3.5–5.0)
ALT: 20 U/L (ref 17–63)
ANION GAP: 9 (ref 5–15)
AST: 23 U/L (ref 15–41)
Alkaline Phosphatase: 83 U/L (ref 38–126)
BUN: 16 mg/dL (ref 6–20)
CHLORIDE: 105 mmol/L (ref 101–111)
CO2: 22 mmol/L (ref 22–32)
Calcium: 9.1 mg/dL (ref 8.9–10.3)
Creatinine, Ser: 1.12 mg/dL (ref 0.61–1.24)
GFR calc Af Amer: >60 mL/min (ref >60)
GFR calc non Af Amer: >60 mL/min (ref >60)
GLUCOSE: 90 mg/dL (ref 65–99)
POTASSIUM: 4.1 mmol/L (ref 3.5–5.1)
SODIUM: 136 mmol/L (ref 135–145)
Total Bilirubin: 0.7 mg/dL (ref 0.3–1.2)
Total Protein: 7.6 g/dL (ref 6.5–8.1)

## 2017-03-07 MED ORDER — TRAMADOL HCL 50 MG PO TABS
50.0000 mg | ORAL_TABLET | Freq: Once | ORAL | Status: AC
  Administered 2017-03-07: 50 mg via ORAL
  Filled 2017-03-07: qty 1

## 2017-03-07 MED ORDER — IOPAMIDOL (ISOVUE-300) INJECTION 61%
100.0000 mL | Freq: Once | INTRAVENOUS | Status: AC | PRN
Start: 2017-03-07 — End: 2017-03-07
  Administered 2017-03-07: 100 mL via INTRAVENOUS

## 2017-03-07 MED ORDER — TRAMADOL HCL 50 MG PO TABS: 50.0000 mg | ORAL_TABLET | Freq: Four times a day (QID) | ORAL | 0 refills | Status: DC | PRN

## 2017-03-07 MED ORDER — ONDANSETRON HCL 4 MG/2ML IJ SOLN
4.0000 mg | Freq: Once | INTRAMUSCULAR | Status: AC
Start: 2017-03-07 — End: 2017-03-07
  Administered 2017-03-07: 4 mg via INTRAVENOUS
  Filled 2017-03-07: qty 2

## 2017-03-07 MED ORDER — MORPHINE SULFATE (PF) 4 MG/ML IV SOLN
4.0000 mg | Freq: Once | INTRAVENOUS | Status: AC
Start: 2017-03-07 — End: 2017-03-07
  Administered 2017-03-07: 4 mg via INTRAVENOUS
  Filled 2017-03-07: qty 1

## 2017-03-07 NOTE — Discharge Instructions (Signed)
Take acetaminophen for less severe pain. Activity as tolerated.  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 Please be very careful with medications that can impact the kidney such as ibuprofen and naproxen.

## 2017-03-07 NOTE — ED Provider Notes (Signed)
[facility]EM[facility]Y [facility]PT Provider Note   CSN: [id number] Arrival date & time: 03/07/17  1458     History   Chief Complaint Chief Complaint  Patient presents with  . Rib Injury    HPI Cameron Fuentes is a 26 y.o. male.  HPI  Cameron Fuentes is a 26 y.o. male who presents to the Emergency Department complaining of left rib pain, upper middle and left abdominal pain and shortness of breath associated with movement.  He states that he was playing soccer 4 days ago when he fell and was subsequently kicked in the left ribs and stepped on at the central chest.  He describes increasing pain to the front and left side of the chest.  Pain is worse with movement, cough and deep breathing.  He has not taken any medications for symptom relief.  He denies vomiting, hematuria, back pain, bruising of the abdomen or chest. No head injury, neck pain or LOC    Past Medical History:  Diagnosis Date  . Hypertension     There are no active problems to display for this patient.   History reviewed. No pertinent surgical history.     Home Medications    Prior to Admission medications   Medication Sig Start Date End Date Taking? Authorizing Provider  cephALEXin (KEFLEX) 500 MG capsule Take 2 capsules (1,000 mg total) by mouth 2 (two) times daily. Patient not taking: Reported on 03/07/2017 11/16/16   Fayrene Helperran, [place], PA-C  ibuprofen (ADVIL,MOTRIN) 600 MG tablet Take 1 tablet (600 mg total) by mouth every 6 (six) hours as needed. Patient not taking: Reported on 03/07/2017 11/16/16   Fayrene Helperran, [place], PA-C  oxyCODONE-acetaminophen (PERCOCET/ROXICET) 5-325 MG tablet Take 1 tablet by mouth every 6 (six) hours as needed for severe pain. Patient not taking: Reported on 03/07/2017 11/16/16   Fayrene Helperran, [place], PA-C    Family History History reviewed. No pertinent family history.  Social History Social History  Substance Use Topics  . Smoking status: Current Every Day Smoker    Packs/day: 0.50    Types: Cigarettes  .  Smokeless tobacco: Never Used  . Alcohol use No     Allergies   Shrimp [shellfish allergy]   Review of Systems Review of Systems  Constitutional: Negative for appetite change, chills and fever.  Respiratory: Positive for shortness of breath. Negative for chest tightness.   Cardiovascular: Positive for chest pain (mid to left chest pain).  Gastrointestinal: Positive for abdominal pain and diarrhea. Negative for nausea and vomiting.  Genitourinary: Negative for difficulty urinating, dysuria, flank pain and hematuria.  Musculoskeletal: Negative for back pain and neck pain.  Skin: Negative for color change and wound.  Neurological: Negative for dizziness, syncope, weakness and headaches.  Psychiatric/Behavioral: Negative for confusion.     Physical Exam Updated Vital Signs BP (!) 147/90 (BP Location: Right Arm)   Pulse 87   Temp 98.3 F (36.8 C) (Oral)   Resp 15   Ht 5\' 5"  (1.651 m)   Wt 81.6 kg (180 lb)   SpO2 100%   BMI 29.95 kg/m   Physical Exam  Constitutional: He is oriented to person, place, and time. He appears well-developed and well-nourished. No distress.  HENT:  Head: Atraumatic.  Mouth/Throat: Oropharynx is clear and moist.  Eyes: EOM are normal. Pupils are equal, round, and reactive to light.  Neck: Normal range of motion. Neck supple.  Cardiovascular: Normal rate, regular rhythm, normal heart sounds and intact distal pulses.   No murmur heard. Pulmonary/Chest: Effort normal and  breath sounds normal. No respiratory distress. He exhibits tenderness.  ttp of the substernal and left anterior chest extending to the lateral upper and lower ribs.  No edema, ecchymosis, or crepitus  Abdominal: Soft. Normal appearance and bowel sounds are normal. He exhibits no distension and no mass. There is tenderness. There is guarding. There is no rebound and no CVA tenderness.    Mild to moderate tenderness of the epigastric and left upper abdomen.  Mild guarding to deep  palpation  Musculoskeletal:  No spinal tenderness or bony deformity.  Neg SLR bilaterally  Neurological: He is alert and oriented to person, place, and time.  Skin: Skin is warm.  Psychiatric: He has a normal mood and affect.  Nursing note and vitals reviewed.    ED Treatments / Results  Labs (all labs ordered are listed, but only abnormal results are displayed) Labs Reviewed  CBC WITH DIFFERENTIAL/PLATELET  COMPREHENSIVE METABOLIC PANEL  URINALYSIS, ROUTINE W REFLEX MICROSCOPIC    EKG  EKG Interpretation None       Radiology Dg Ribs Unilateral W/chest Left  Result Date: 03/07/2017 CLINICAL DATA:  Left-sided rib pain EXAM: LEFT RIBS AND CHEST - 3+ VIEW COMPARISON:  None. FINDINGS: No fracture or other bone lesions are seen involving the ribs. There is no evidence of pneumothorax or pleural effusion. Both lungs are clear. Heart size and mediastinal contours are within normal limits. IMPRESSION: No rib fracture. Electronically Signed   By: Deatra Robinson M.D.   On: 03/07/2017 15:54    Procedures Procedures (including critical care time)  Medications Ordered in ED Medications - No data to display   Initial Impression / Assessment and Plan / ED Course  I have reviewed the triage vital signs and the nursing notes.  Pertinent labs & imaging results that were available during my care of the patient were reviewed by me and considered in my medical decision making (see chart for details).     1650  Pt also seen by Dr. Preston Fleeting and care plan discussed.  Will obtain CT abd/pelvis/chest and labs.    1730  End of shift, pt signed out to Dr. Preston Fleeting, work up pending   Final Clinical Impressions(s) / ED Diagnoses   Final diagnoses:  None    New Prescriptions New Prescriptions   No medications on file     Rosey Bath 03/07/17 1740    Dione Booze, MD 03/08/17 [phone]

## 2017-03-07 NOTE — ED Triage Notes (Signed)
Reports playing soccer on Sunday and getting knocked down and kicked in the left ribs.  States the pain is increasing and hard to take a deep breath.

## 2017-03-07 NOTE — ED Notes (Signed)
Pt alert & oriented x4, stable gait. Patient given discharge instructions, paperwork & prescription(s). Patient  instructed to stop at the registration desk to finish any additional paperwork. Patient verbalized understanding. Pt left department w/ no further questions. 

## 2017-03-13 ENCOUNTER — Inpatient Hospital Stay (INDEPENDENT_AMBULATORY_CARE_PROVIDER_SITE_OTHER): Payer: Self-pay | Admitting: Physician Assistant

## 2017-11-13 ENCOUNTER — Ambulatory Visit (INDEPENDENT_AMBULATORY_CARE_PROVIDER_SITE_OTHER): Payer: Medicaid Other

## 2017-11-13 ENCOUNTER — Ambulatory Visit (HOSPITAL_COMMUNITY)
Admission: EM | Admit: 2017-11-13 | Discharge: 2017-11-13 | Disposition: A | Discharge status: Home / Self Care | Payer: Medicaid Other | Attending: Family Medicine | Admitting: Family Medicine

## 2017-11-13 ENCOUNTER — Encounter (HOSPITAL_COMMUNITY): Payer: Self-pay | Admitting: Emergency Medicine

## 2017-11-13 DIAGNOSIS — M25512 Pain in left shoulder: Secondary | ICD-10-CM

## 2017-11-13 DIAGNOSIS — R519 Headache, unspecified: Secondary | ICD-10-CM

## 2017-11-13 DIAGNOSIS — M5489 Other dorsalgia: Secondary | ICD-10-CM | POA: Diagnosis not present

## 2017-11-13 DIAGNOSIS — M546 Pain in thoracic spine: Secondary | ICD-10-CM | POA: Diagnosis not present

## 2017-11-13 DIAGNOSIS — W11XXXA Fall on and from ladder, initial encounter: Secondary | ICD-10-CM

## 2017-11-13 DIAGNOSIS — Y30XXXA Falling, jumping or pushed from a high place, undetermined intent, initial encounter: Secondary | ICD-10-CM | POA: Diagnosis not present

## 2017-11-13 DIAGNOSIS — M549 Dorsalgia, unspecified: Secondary | ICD-10-CM

## 2017-11-13 DIAGNOSIS — R51 Headache: Secondary | ICD-10-CM | POA: Diagnosis not present

## 2017-11-13 DIAGNOSIS — M542 Cervicalgia: Secondary | ICD-10-CM

## 2017-11-13 DIAGNOSIS — W1789XA Other fall from one level to another, initial encounter: Secondary | ICD-10-CM

## 2017-11-13 NOTE — ED Provider Notes (Signed)
[facility]   [id number] 11/13/17 Arrival Time: 1333  ASSESSMENT & PLAN:  1. Injury resulting from fall from height   2. Pain in joint of left shoulder   3. Upper back pain   4. Neck pain   5. Mild headache    Imaging: Dg Chest 2 View  Result Date: 11/13/2017 CLINICAL DATA:  Larey SeatFell 25 ft from a ladder onto his left side. Smoker. EXAM: CHEST  2 VIEW COMPARISON:  Chest and left rib radiographs dated 03/07/2017 and chest, abdomen and pelvis CT dated 03/07/2017. FINDINGS: Normal sized heart.  Clear lungs.  No fracture or pneumothorax seen. IMPRESSION: Normal examination. Electronically Signed   By: Beckie SaltsSteven  Reid M.D.   On: 11/13/2017 14:51   Dg Cervical Spine Complete  Result Date: 11/13/2017 CLINICAL DATA:  Recent fall from ladder with neck pain, initial encounter EXAM: CERVICAL SPINE - COMPLETE 4+ VIEW COMPARISON:  None. FINDINGS: Seven cervical segments are well visualized. Vertebral body height is well maintained. Neural foramina are widely patent bilaterally. No acute fracture or dislocation is noted. No soft tissue abnormality is seen. The odontoid is within normal limits. IMPRESSION: No acute abnormality noted. Electronically Signed   By: Alcide CleverMark  Lukens M.D.   On: 11/13/2017 15:20   Dg Thoracic Spine 2 View  Result Date: 11/13/2017 CLINICAL DATA:  Fall from ladder with left-sided chest pain, initial encounter EXAM: THORACIC SPINE 2 VIEWS COMPARISON:  None. FINDINGS: There is no evidence of thoracic spine fracture. Alignment is normal. No other significant bone abnormalities are identified. IMPRESSION: No acute abnormality noted. Electronically Signed   By: Alcide CleverMark  Lukens M.D.   On: 11/13/2017 15:09   Dg Shoulder Left  Result Date: 11/13/2017 CLINICAL DATA:  Per pt: fell from a 25 foot ladder that broke around 11: 30 today. Landed onto the left side of body. Pain is the left shoulder, medial clavicle, mid clavicle, posteriorly medial border of the scapula. No prior injury to the  left.*comment was truncated* EXAM: LEFT SHOULDER - 2+ VIEW COMPARISON:  None. FINDINGS: Glenohumeral joint is intact. No evidence of scapular fracture or humeral fracture. The acromioclavicular joint is intact. IMPRESSION: No fracture or dislocation. Electronically Signed   By: Genevive BiStewart  Edmunds M.D.   On: 11/13/2017 15:00   Written head injury precautions given; verbally reviewed. See AVS. OTC Tylenol and/or Advil.  Will f/u if not seeing improvement in MSK symptoms over the next week.  Reviewed expectations re: course of current medical issues. Questions answered. Outlined signs and symptoms indicating need for more acute intervention. Patient verbalized understanding. After Visit Summary given.  SUBJECTIVE: History from: patient. Cameron GuadalajaraJacinto Fuentes is a 27 y.o. male who reports falling from a ladder today. Estimates height of 20 ft. "Ladder slid off building and went sideways. I stayed on until the ground came close the jumped off. Kind of twisted and landed on my left side." Does not remember if he hit his head. No LOC. Witnessed by neighbor who helped him stand. Patient remembers full episode; no amnesia. Ambulatory without difficulty. No dysequilbrium. No HA/visual changes/n/v.  Describes pain of his left shoulder and clavicle. Some generalized back discomfort. No extremity sensation changes or weakness.  Shoulder/clavicle pain described as ache with movement, esp with raising LUE. Better when arm by his side. Difficult to localize. Back discomfort described as stiffness and soreness. Mostly of upper back. Also mentions neck soreness. Mainly over left side of neck, esp when turning his head to the left.  All symptoms are stable without  worsening and are fairly persistent.  No OTC analgesics taken.  ROS: As per HPI. All other systems negative.   OBJECTIVE:  Vitals:   11/13/17 1354  BP: 135/68  Pulse: 87  Resp: 20  Temp: 98 F (36.7 C)  TempSrc: Oral  SpO2: 99%    General  appearance: alert; no distress Neck: supple with FROM but with discomfort described when turning head to the left; questions mild midline tenderness Extremities: no cyanosis or edema; symmetrical with no gross deformities L shoulder: diffuse tenderness over his left shoulder and clavicle with no swelling and no bruising; ROM: limited by pain Chest wall: nontender Lungs: unlabored respirations; symmetrical air entry; no resp distress CV: RRR; normal extremity capillary refill in extremeties Skin: warm and dry Neurologic: normal gait; normal symmetric reflexes in all extremities; normal sensation in all extremities Psychological: alert and cooperative; normal mood and affect  Allergies  Allergen Reactions  . Shrimp [Shellfish Allergy] Anaphylaxis    Past Medical History:  Diagnosis Date  . Hypertension    Social History   Socioeconomic History  . Marital status: Single    Spouse name: Not on file  . Number of children: Not on file  . Years of education: Not on file  . Highest education level: Not on file  Social Needs  . Financial resource strain: Not on file  . Food insecurity - worry: Not on file  . Food insecurity - inability: Not on file  . Transportation needs - medical: Not on file  . Transportation needs - non-medical: Not on file  Occupational History  . Not on file  Tobacco Use  . Smoking status: Current Every Day Smoker    Packs/day: 0.50    Types: Cigarettes  . Smokeless tobacco: Never Used  Substance and Sexual Activity  . Alcohol use: No  . Drug use: No  . Sexual activity: Not on file  Other Topics Concern  . Not on file  Social History Narrative  . Not on file   FH: HTN  History reviewed. No pertinent surgical history.    Mardella Layman, MD 11/20/17 [phone]

## 2017-11-13 NOTE — Discharge Instructions (Signed)
You may use over the counter ibuprofen or acetaminophen as needed. ° °Follow up with your primary care doctor or here within 48-72 hours. Do your best to ensure adequate rest. Take acetaminophen (Tylenol) every 4-6 hours as needed for pain. Often individuals will develop a headache associated with mild nausea in the days or hours after a head injury. This is called a concussion and may require further follow up. ° °Please seek prompt medical care if: °You have: °A very bad (severe) headache that is not helped by medicine. °Trouble walking or weakness in your arms and legs. °Clear or bloody fluid coming from your nose or ears. °Changes in your seeing (vision). °Jerky movements that you cannot control (seizure). °You throw up (vomit). °Your symptoms get worse. °You lose balance. °Your speech is slurred. °You pass out. °You are sleepier and have trouble staying awake. °The black centers of your eyes (pupils) change in size. ° °These symptoms may be an emergency. Do not wait to see if the symptoms will go away. Get medical help right away. Call your local emergency services. Do not drive yourself to the hospital. °  °

## 2017-11-13 NOTE — ED Triage Notes (Signed)
PT C/O: pt reports he fell of ladder today around 1130 .... Fall was approx 25 ft  Reports he fell on left side and inj his shoulder, collar bone, and back pain  Landed on grass  Also reports HA and light headed.   Has a scrape on left side face   Denies LOC, numbness/weakness of extremties  TAKING MEDS: none   A&O x4... NAD... Ambulatory

## 2018-04-03 ENCOUNTER — Encounter (HOSPITAL_COMMUNITY): Payer: Self-pay | Admitting: Emergency Medicine

## 2018-04-03 ENCOUNTER — Emergency Department (HOSPITAL_COMMUNITY)
Admission: EM | Admit: 2018-04-03 | Discharge: 2018-04-03 | Disposition: A | Discharge status: Home / Self Care | Payer: Medicaid Other | Attending: Emergency Medicine | Admitting: Emergency Medicine

## 2018-04-03 ENCOUNTER — Other Ambulatory Visit: Payer: Self-pay

## 2018-04-03 ENCOUNTER — Emergency Department (HOSPITAL_COMMUNITY): Payer: Medicaid Other

## 2018-04-03 DIAGNOSIS — Y9289 Other specified places as the place of occurrence of the external cause: Secondary | ICD-10-CM | POA: Diagnosis not present

## 2018-04-03 DIAGNOSIS — F17210 Nicotine dependence, cigarettes, uncomplicated: Secondary | ICD-10-CM | POA: Diagnosis not present

## 2018-04-03 DIAGNOSIS — Y990 Civilian activity done for income or pay: Secondary | ICD-10-CM | POA: Diagnosis not present

## 2018-04-03 DIAGNOSIS — I10 Essential (primary) hypertension: Secondary | ICD-10-CM | POA: Diagnosis not present

## 2018-04-03 DIAGNOSIS — W312XXA Contact with powered woodworking and forming machines, initial encounter: Secondary | ICD-10-CM | POA: Diagnosis not present

## 2018-04-03 DIAGNOSIS — S51811A Laceration without foreign body of right forearm, initial encounter: Secondary | ICD-10-CM | POA: Insufficient documentation

## 2018-04-03 DIAGNOSIS — Y9389 Activity, other specified: Secondary | ICD-10-CM | POA: Diagnosis not present

## 2018-04-03 MED ORDER — FENTANYL CITRATE (PF) 100 MCG/2ML IJ SOLN
50.0000 ug | Freq: Once | INTRAMUSCULAR | Status: AC
  Administered 2018-04-03: 50 ug via INTRAVENOUS

## 2018-04-03 MED ORDER — TRAMADOL HCL 50 MG PO TABS: 50.0000 mg | ORAL_TABLET | Freq: Four times a day (QID) | ORAL | 0 refills | Status: AC | PRN

## 2018-04-03 MED ORDER — FENTANYL CITRATE (PF) 100 MCG/2ML IJ SOLN
INTRAMUSCULAR | Status: AC
  Filled 2018-04-03: qty 2

## 2018-04-03 MED ORDER — LIDOCAINE-EPINEPHRINE (PF) 2 %-1:200000 IJ SOLN
10.0000 mL | Freq: Once | INTRAMUSCULAR | Status: AC
  Administered 2018-04-03: 10 mL via INTRADERMAL
  Filled 2018-04-03: qty 20

## 2018-04-03 MED ORDER — HYDROMORPHONE HCL 2 MG/ML IJ SOLN
1.0000 mg | Freq: Once | INTRAMUSCULAR | Status: AC
  Administered 2018-04-03: 1 mg via INTRAVENOUS
  Filled 2018-04-03: qty 1

## 2018-04-03 MED ORDER — CEPHALEXIN 500 MG PO CAPS: 500.0000 mg | ORAL_CAPSULE | Freq: Three times a day (TID) | ORAL | 0 refills | Status: AC

## 2018-04-03 NOTE — Discharge Instructions (Signed)
Please keeps wound covered and away from sunlight for the next 2 months to decrease scar formation.  Monitor wound for signs of infection.  Take antibiotic as prescribed.  Be aware pain medication can cause drowsiness.  Return in 7 days for sutures removal.

## 2018-04-03 NOTE — ED Triage Notes (Addendum)
Pt was at work Psychologist, occupationalusing circular skill saw- the saw kicked back and lacerated his right forearm. Pt able to move fingers in right hand and sensation intact. Pt received 50mcg fentanyl from EMS

## 2018-04-03 NOTE — ED Notes (Signed)
Dressed pt's wound with nonadherant dressing and ointment

## 2018-04-03 NOTE — ED Provider Notes (Signed)
[facility] EMERGENCY DEPARTMENT Provider Note   CSN: [id number] Arrival date & time: 04/03/18  1005     History   Chief Complaint Chief Complaint  Patient presents with  . Extremity Laceration    HPI Cameron Fuentes is a 27 y.o. male.  HPI   27 year old male presenting for evaluation of right arm injury.  Patient brought here via ambulance.  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  He was using a circular skill saw to trim some rotten wood when the blade of the saw kicked back and cut his right forearm.  Incident happened approximately an hour ago.  He report acute onset of sharp throbbing pain to the affected area, moderate in severity, nonradiating without any numbness or weakness.  He is right-hand dominant.  He is up-to-date with tetanus.  He did receive pain medication by EMS which did provide some improvement.  Rates pain as 5 out of 10 currently.  Past Medical History:  Diagnosis Date  . Hypertension     There are no active problems to display for this patient.   History reviewed. No pertinent surgical history.      Home Medications    Prior to Admission medications   Medication Sig Start Date End Date Taking? Authorizing Provider  traMADol (ULTRAM) 50 MG tablet Take 1 tablet (50 mg total) by mouth every 6 (six) hours as needed. 03/07/17   Dione Booze, MD    Family History History reviewed. No pertinent family history.  Social History Social History   Tobacco Use  . Smoking status: Current Every Day Smoker    Packs/day: 0.50    Types: Cigarettes  . Smokeless tobacco: Never Used  Substance Use Topics  . Alcohol use: No  . Drug use: No     Allergies   Shrimp [shellfish allergy]   Review of Systems Review of Systems  Constitutional: Negative for fever.  Skin: Positive for wound.  Neurological: Negative for numbness.     Physical Exam Updated Vital Signs Ht 5\' 5"  (1.651 m)   Wt 90.7 kg  (200 lb)   BMI 33.28 kg/m   Physical Exam  Constitutional: He appears well-developed and well-nourished. No distress.  HENT:  Head: Atraumatic.  Eyes: Conjunctivae are normal.  Neck: Neck supple.  Musculoskeletal: He exhibits tenderness (Right forearm: 8 cm oblique laceration with jagged edge noted to the dorsum of the mid forearm with intact sensation distally, normal wrist flexion and extension, no foreign body noted.  It is tender to palpation.).  Neurological: He is alert.  Skin: No rash noted.  Psychiatric: He has a normal mood and affect.  Nursing note and vitals reviewed.    ED Treatments / Results  Labs (all labs ordered are listed, but only abnormal results are displayed) Labs Reviewed - No data to display  EKG None  Radiology Dg Forearm Right  Result Date: 04/03/2018 CLINICAL DATA:  Circular saw laceration, initial encounter EXAM: RIGHT FOREARM - 2 VIEW COMPARISON:  None. FINDINGS: Soft tissue defect is noted along the dorsum of the distal forearm consistent with the recent injury. No definitive radiopaque foreign body is noted. No underlying bony abnormality is seen. IMPRESSION: Soft tissue injury without acute bony abnormality. Electronically Signed   By: Alcide Clever M.D.   On: 04/03/2018 11:27    Procedures .Marland KitchenLaceration Repair Date/Time: 04/03/2018 12:05 PM Performed by: Fayrene Helper, PA-C Authorized by: Fayrene Helper, PA-C   Consent:    Consent  obtained:  Verbal   Consent given by:  Patient   Risks discussed:  Infection, poor cosmetic result and poor wound healing   Alternatives discussed:  Observation and delayed treatment Anesthesia (see MAR for exact dosages):    Anesthesia method:  Local infiltration   Local anesthetic:  Lidocaine 2% WITH epi Laceration details:    Location:  Shoulder/arm   Shoulder/arm location:  R lower arm   Length (cm):  8   Depth (mm):  5 Repair type:    Repair type:  Complex Pre-procedure details:    Preparation:  Patient was  prepped and draped in usual sterile fashion and imaging obtained to evaluate for foreign bodies Exploration:    Limited defect created (wound extended): yes     Hemostasis achieved with:  Direct pressure   Wound exploration: wound explored through full range of motion and entire depth of wound probed and visualized     Wound extent: no fascia violation noted, no foreign bodies/material noted, no muscle damage noted, no nerve damage noted, no tendon damage noted, no underlying fracture noted and no vascular damage noted     Contaminated: no   Treatment:    Area cleansed with:  Saline   Amount of cleaning:  Extensive   Irrigation solution:  Sterile saline   Irrigation volume:  1000   Irrigation method:  Pressure wash   Visualized foreign bodies/material removed: no     Debridement:  Moderate   Undermining:  Minimal   Scar revision: no   Skin repair:    Repair method:  Sutures   Suture size:  5-0   Suture material:  Prolene   Suture technique:  Simple interrupted   Number of sutures:  15 Approximation:    Approximation:  Close Post-procedure details:    Dressing:  Non-adherent dressing   Patient tolerance of procedure:  Tolerated well, no immediate complications   (including critical care time)  Medications Ordered in ED Medications  fentaNYL (SUBLIMAZE) injection 50 mcg (50 mcg Intravenous Given 04/03/18 1025)  lidocaine-EPINEPHrine (XYLOCAINE W/EPI) 2 %-1:200000 (PF) injection 10 mL (10 mLs Intradermal Given 04/03/18 1050)  HYDROmorphone (DILAUDID) injection 1 mg (1 mg Intravenous Given 04/03/18 1133)     Initial Impression / Assessment and Plan / ED Course  I have reviewed the triage vital signs and the nursing notes.  Pertinent labs & imaging results that were available during my care of the patient were reviewed by me and considered in my medical decision making (see chart for details).     BP (!) 137/92   Pulse 92   Ht 5\' 5"  (1.651 m)   Wt 90.7 kg (200 lb)   SpO2 97%    BMI 33.28 kg/m    Final Clinical Impressions(s) / ED Diagnoses   Final diagnoses:  Laceration of right forearm without complication, initial encounter    ED Discharge Orders    None     10:48 AM Patient here for evaluation of laceration involving his right forearm which is his dominant hand.  On initial exam, no obvious nerve tendon or muscle involvement.  Will obtain x-ray of the affected area.  Plan on laceration repair with sutures.  He is up-to-date with tetanus.  His pain is currently controlled at this time.  12:07 PM X-ray of right forearm demonstrate demonstrate soft tissue injury without acute bony abnormalities.  Wound was cleansed, and suture repaired by me.  No evidence of nerve, tendons, or muscle injury.  Recommend appropriate wound care and  patient should return in 7 days for suture removal.  Given the extensiveness of his wound and the risk for infection, antibiotic and pain medication prescribed.  Patient was made aware to avoid using opiate while operating heavy machinery or driving. In order to decrease risk of narcotic abuse. Pt's record were checked using the [place] Controlled Substance database.     Fayrene Helperran, Lashika Erker, PA-C 04/03/18 1212    Cathren LaineSteinl, Kevin, MD 04/03/18 [phone]

## 2018-06-14 IMAGING — DX DG SHOULDER 2+V*L*
3 series · 3 of 3 positions shown · non-contrast
Comparison: None.

CLINICAL DATA: Per pt: fell from a 25 foot ladder that broke around
11: 30 today. Landed onto the left side of body. Pain is the left
shoulder, medial clavicle, mid clavicle, posteriorly medial border
of the scapula. No prior injury to the left...*comment was
truncated*

EXAM:
LEFT SHOULDER - 2+ VIEW

[shoulder ap]
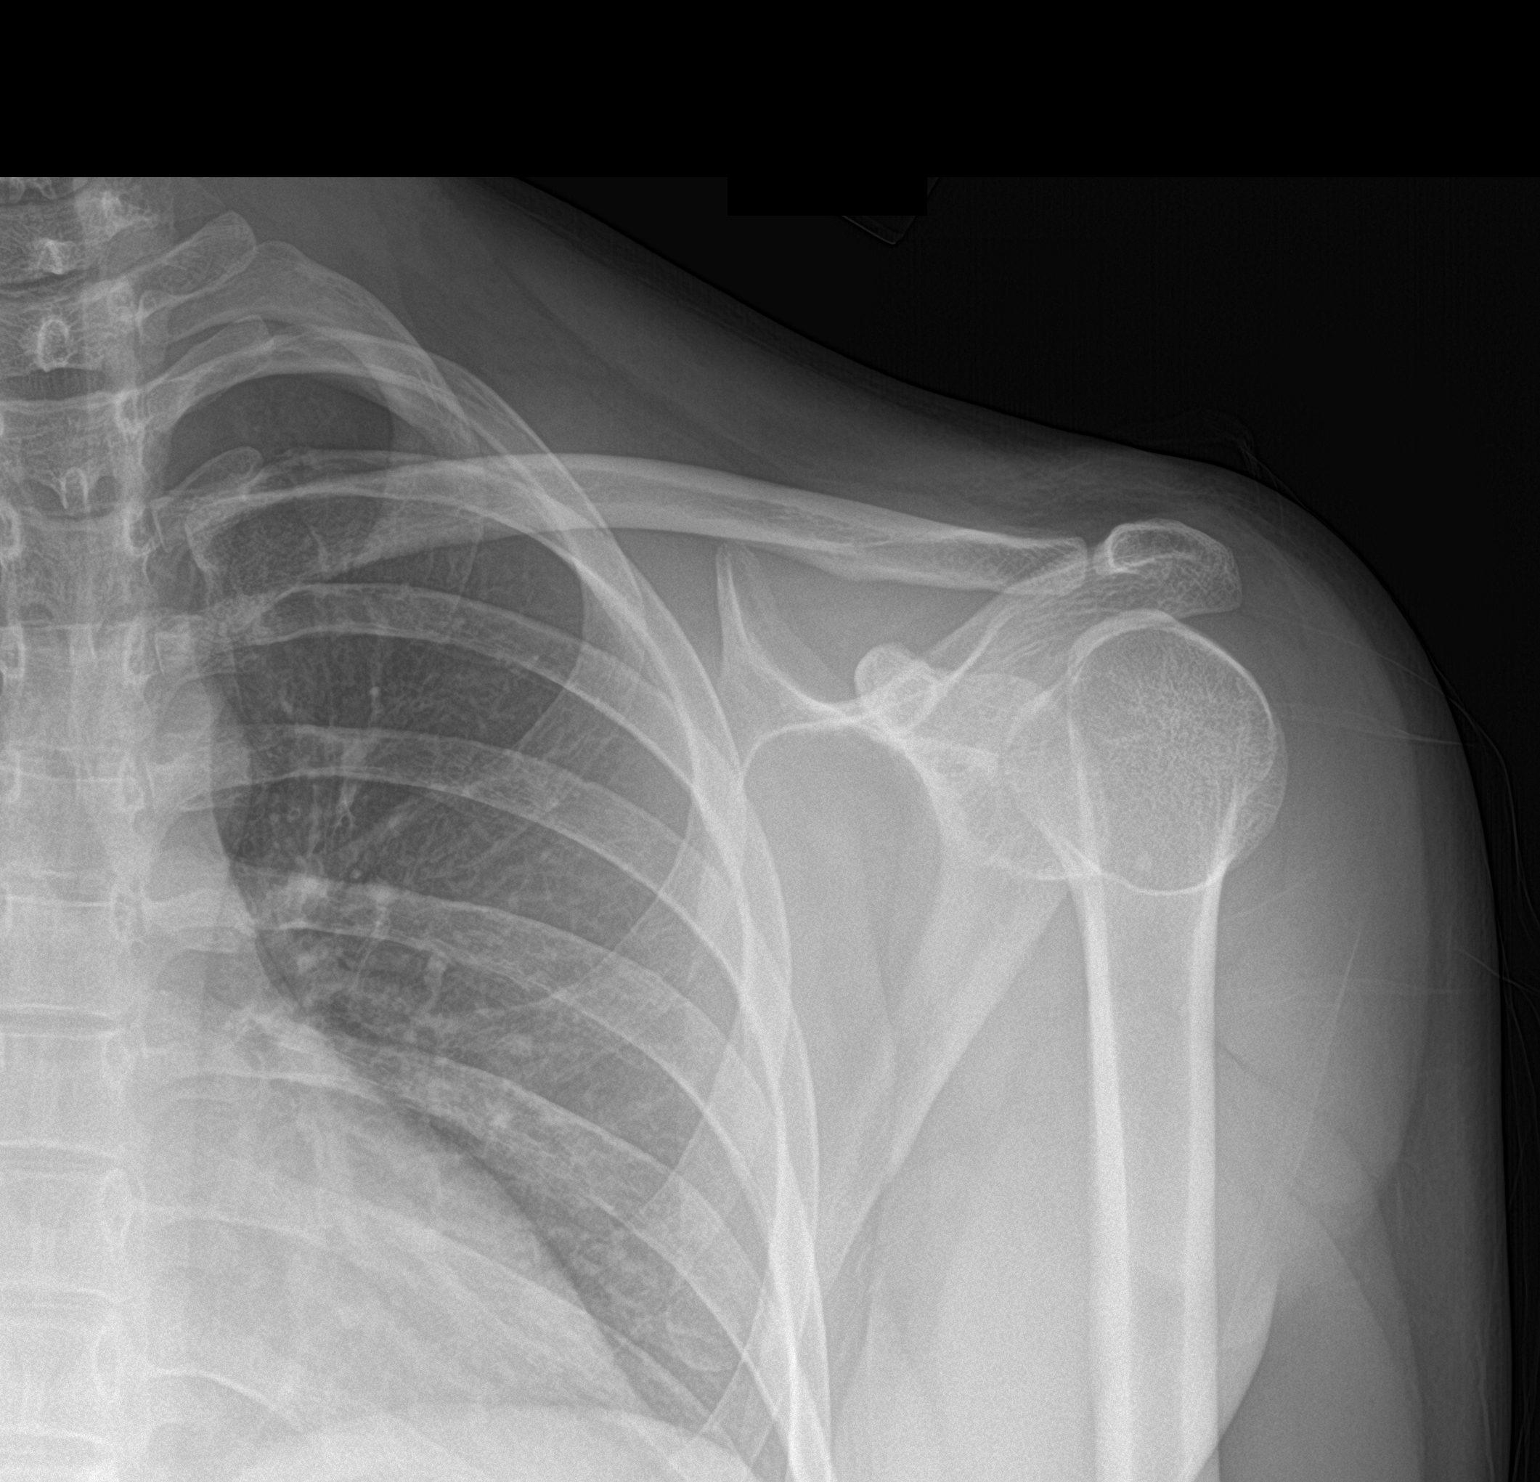

[shoulder grashey]
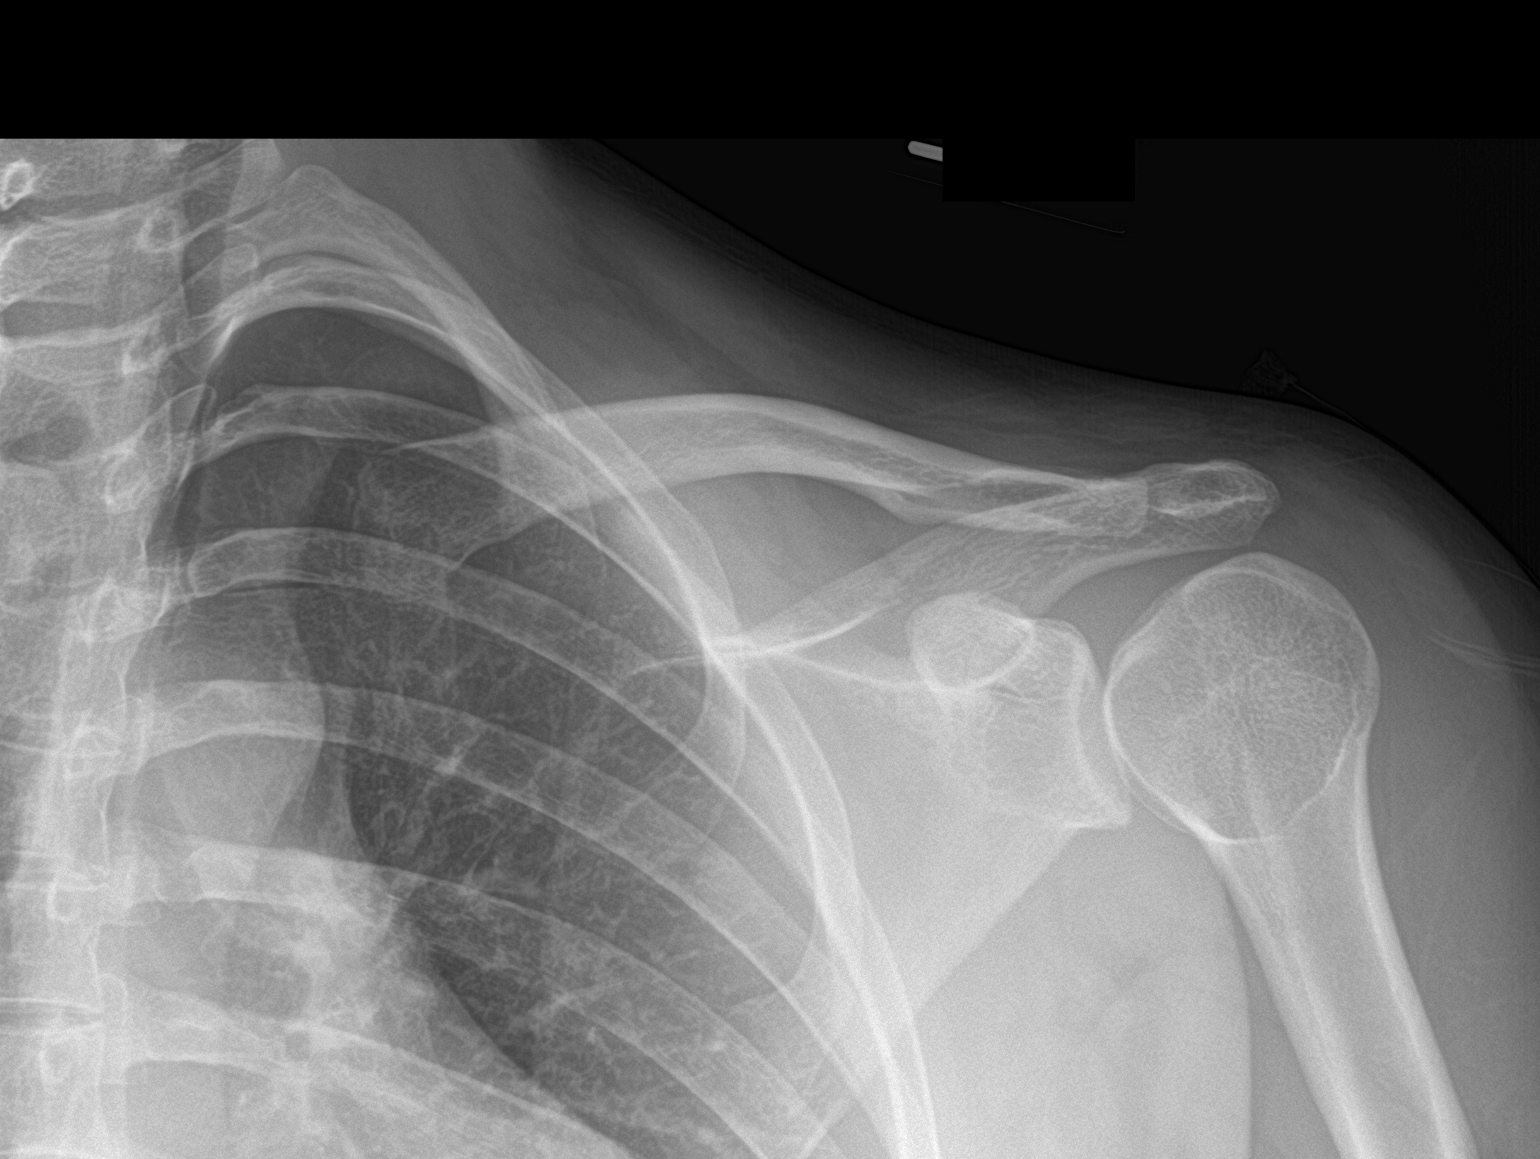

[shoulder y-view]
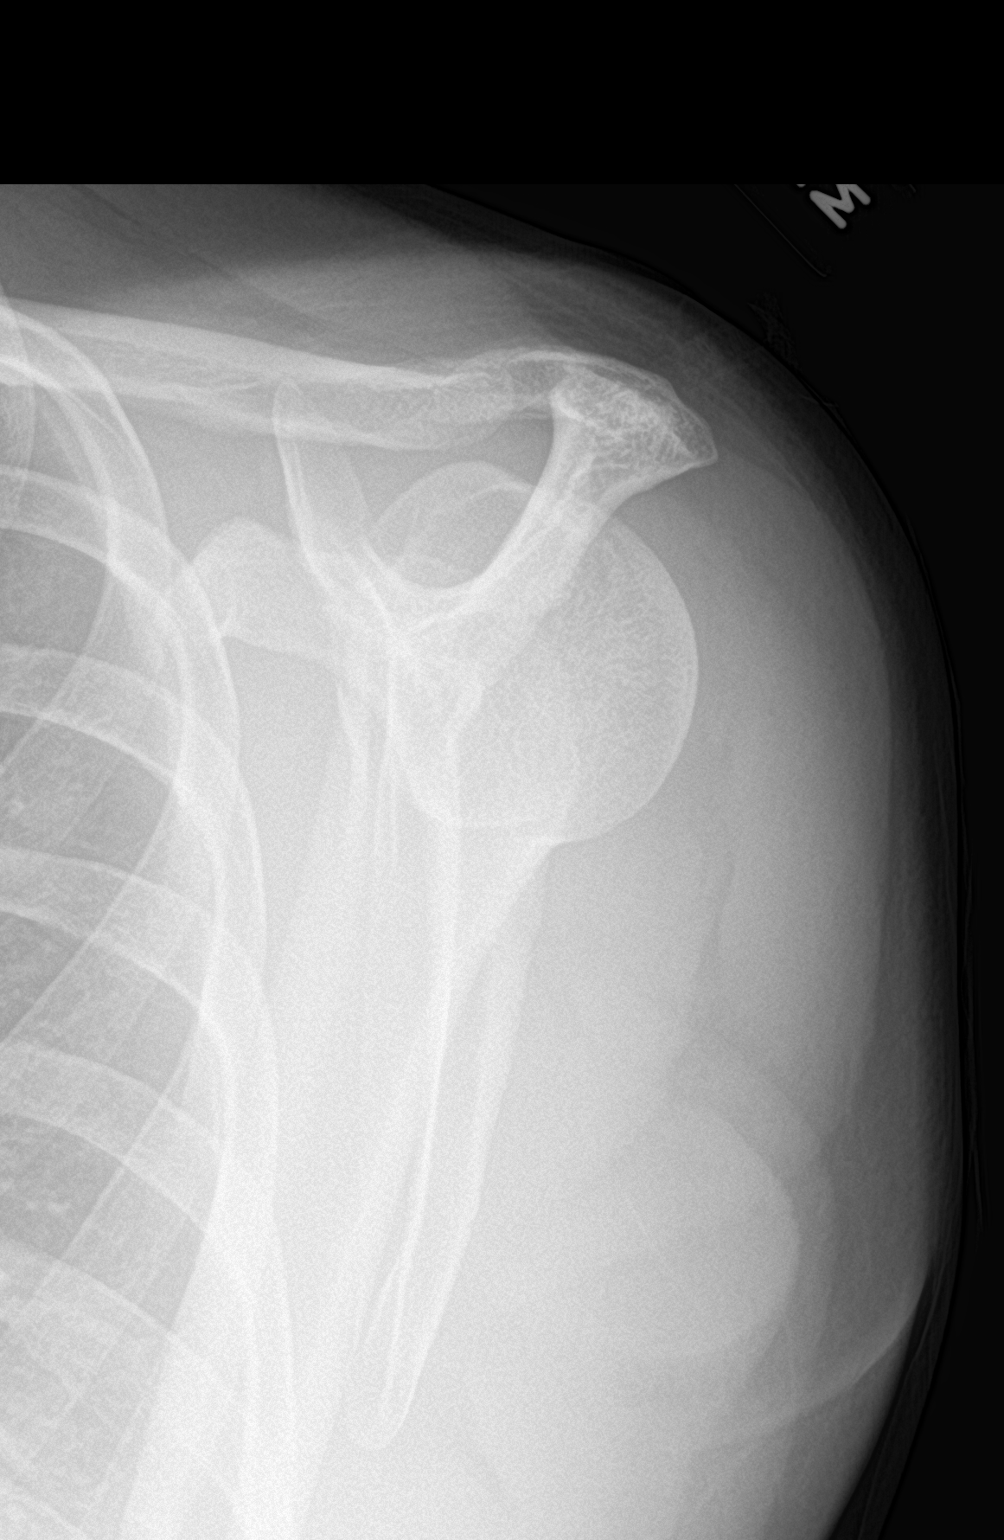

[3 of 3 positions shown; findings below may reference images not displayed]

FINDINGS: Glenohumeral joint is intact. No evidence of scapular fracture or
humeral fracture. The acromioclavicular joint is intact.
IMPRESSION: No fracture or dislocation.

## 2018-10-19 ENCOUNTER — Emergency Department (HOSPITAL_COMMUNITY)
Admission: EM | Admit: 2018-10-19 | Discharge: 2018-10-19 | Disposition: A | Discharge status: Home / Self Care | Payer: Medicaid Other | Attending: Emergency Medicine | Admitting: Emergency Medicine

## 2018-10-19 ENCOUNTER — Encounter (HOSPITAL_COMMUNITY): Payer: Self-pay

## 2018-10-19 DIAGNOSIS — F17210 Nicotine dependence, cigarettes, uncomplicated: Secondary | ICD-10-CM | POA: Insufficient documentation

## 2018-10-19 DIAGNOSIS — Z79899 Other long term (current) drug therapy: Secondary | ICD-10-CM | POA: Insufficient documentation

## 2018-10-19 DIAGNOSIS — H66002 Acute suppurative otitis media without spontaneous rupture of ear drum, left ear: Secondary | ICD-10-CM | POA: Insufficient documentation

## 2018-10-19 DIAGNOSIS — H6121 Impacted cerumen, right ear: Secondary | ICD-10-CM

## 2018-10-19 DIAGNOSIS — H6123 Impacted cerumen, bilateral: Secondary | ICD-10-CM | POA: Insufficient documentation

## 2018-10-19 DIAGNOSIS — I10 Essential (primary) hypertension: Secondary | ICD-10-CM

## 2018-10-19 MED ORDER — AMOXICILLIN-POT CLAVULANATE 875-125 MG PO TABS: 1.0000 | ORAL_TABLET | Freq: Two times a day (BID) | ORAL | 0 refills | Status: AC

## 2018-10-19 MED ORDER — CARBAMIDE PEROXIDE 6.5 % OT SOLN: 5.0000 [drp] | Freq: Two times a day (BID) | OTIC | 0 refills | Status: AC

## 2018-10-19 NOTE — ED Triage Notes (Signed)
Pt presents with c/o bilateral ear pain. Pt reports the symptoms have been present for several days. Pt reports his ears are itching and throbbing and he is having difficulty hearing.

## 2018-10-19 NOTE — Discharge Instructions (Signed)
You have ear wax in both ears, use ear drops as directed to help with this. You also have an ear infection in your left ear (and could potentially have one in your right ear but it's hard to say since the ear wax blocks visualization of the ear drum on that side). Take antibiotic as directed until completed. Alternate between tylenol and ibuprofen as needed for pain. Follow up with your regular doctor in 1 week for recheck of symptoms. Return to the ER for emergent changes or worsening symptoms.   Your blood pressure was elevated today. Eat a low salt/low sodium diet. Keep a log of your blood pressure readings from every morning and evening (making sure to give yourself at least 15 minutes of rest prior to checking it) and take it to your doctor's office at your next appointment for ongoing management of your blood pressure. Stay well hydrated and get plenty of rest. Avoid caffeine and other over the counter products that would make your blood pressure go up (such as decongestants, excedrin, etc). Follow up with your regular doctor in 1 week for recheck of symptoms and ongoing management of your blood pressure. Return to the ER for emergent changes or worsening symptoms.

## 2018-10-19 NOTE — ED Provider Notes (Signed)
[facility] Note   CSN: [id number] Arrival date & time: 10/19/18  1219     History   Chief Complaint Chief Complaint  Patient presents with  . Otalgia    HPI Cameron Fuentes is a 28 y.o. male with a PMHx of HTN, who presents to the ED with complaints of bilateral ear pain for the last 2 days.  Patient states that he works outside a lot and started having ear pain 2 days ago, he describes his pain as 9/10 constant aching and itching nonradiating bilateral ear pain with no known aggravating factors and unrelieved with BC powders and Tylenol.  He reports decreased hearing out of the right ear with normal hearing out of the left.  Of note, he has a history of hypertension but is not on any medications.  He denies any headache, vision changes, lightheadedness, or any other complaints related to his blood pressure.  He also denies any ear drainage, rhinorrhea, sore throat, fevers, chills, or any other complaints at this time.  The history is provided by the patient and medical records. No language interpreter was used.  Otalgia  Pertinent negatives include no ear discharge, no headaches, no rhinorrhea and no sore throat.    Past Medical History:  Diagnosis Date  . Hypertension     There are no active problems to display for this patient.   History reviewed. No pertinent surgical history.      Home Medications    Prior to Admission medications   Medication Sig Start Date End Date Taking? Authorizing Provider  cephALEXin (KEFLEX) 500 MG capsule Take 1 capsule (500 mg total) by mouth 3 (three) times daily. 04/03/18   Fayrene Helper, PA-C  traMADol (ULTRAM) 50 MG tablet Take 1 tablet (50 mg total) by mouth every 6 (six) hours as needed for moderate pain or severe pain. 04/03/18   Fayrene Helper, PA-C    Family History History reviewed. No pertinent family history.  Social History Social History   Tobacco Use  . Smoking status: Current Every  Day Smoker    Packs/day: 0.50    Types: Cigarettes  . Smokeless tobacco: Never Used  Substance Use Topics  . Alcohol use: No  . Drug use: No     Allergies   Shrimp [shellfish allergy]   Review of Systems Review of Systems  Constitutional: Negative for chills and fever.  HENT: Positive for ear pain. Negative for ear discharge, rhinorrhea and sore throat.   Eyes: Negative for visual disturbance.  Allergic/Immunologic: Negative for immunocompromised state.  Neurological: Negative for light-headedness and headaches.     Physical Exam Updated Vital Signs BP (!) 148/97   Pulse 61   Temp 98.7 F (37.1 C) (Oral)   Resp 14   Ht 5\' 6"  (1.676 m)   Wt 86.2 kg   SpO2 100%   BMI 30.67 kg/m   Physical Exam Vitals signs and nursing note reviewed.  Constitutional:      General: He is not in acute distress.    Appearance: Normal appearance. He is well-developed. He is not toxic-appearing.     Comments: Afebrile, nontoxic, NAD  HENT:     Head: Normocephalic and atraumatic.     Right Ear: External ear normal. Decreased hearing noted. There is impacted cerumen.     Left Ear: Hearing and external ear normal. A middle ear effusion is present. Tympanic membrane is erythematous and bulging.     Ears:     Comments: R ear  with cerumen impaction, unable to visualize TM, canal otherwise clear without drainage/erythema/swelling. Cerumen ~50% removed with lighted curette but remainder too deep to reach, pt reported improved hearing after procedure.  L ear with small amount of cerumen in canal which was removed entirely, TM erythematous and bulging with suppurative effusion. Canal otherwise clear.  Eyes:     General:        Right eye: No discharge.        Left eye: No discharge.     Conjunctiva/sclera: Conjunctivae normal.  Neck:     Musculoskeletal: Normal range of motion and neck supple.  Cardiovascular:     Rate and Rhythm: Normal rate.     Pulses: Normal pulses.  Pulmonary:      Effort: Pulmonary effort is normal. No respiratory distress.  Abdominal:     General: There is no distension.  Musculoskeletal: Normal range of motion.  Skin:    General: Skin is warm and dry.     Findings: No rash.  Neurological:     Mental Status: He is alert and oriented to person, place, and time.     Sensory: Sensation is intact. No sensory deficit.     Motor: Motor function is intact.  Psychiatric:        Mood and Affect: Mood and affect normal.        Behavior: Behavior normal.      ED Treatments / Results  Labs (all labs ordered are listed, but only abnormal results are displayed) Labs Reviewed - No data to display  EKG None  Radiology No results found.  Procedures .Ear Cerumen Removal Date/Time: 10/19/2018 3:11 PM Performed by: Rhona Raider, PA-C Authorized by: Rhona Raider, [place]   Consent:    Consent obtained:  Verbal   Consent given by:  Patient   Risks discussed:  Infection, bleeding, incomplete removal, TM perforation and pain   Alternatives discussed:  Alternative treatment Procedure details:    Location:  L ear and R ear   Procedure type: curette   Post-procedure details:    Hearing quality:  Improved   Patient tolerance of procedure:  Tolerated well, no immediate complications Comments:     L ear small cerumen amount removed completely; R ear cerumen impaction only ~50% removed   (including critical care time)  Medications Ordered in ED Medications - No data to display   Initial Impression / Assessment and Plan / ED Course  I have reviewed the triage vital signs and the nursing notes.  Pertinent labs & imaging results that were available during my care of the patient were reviewed by me and considered in my medical decision making (see chart for details).     28 y.o. male here with bilateral ear pain for 2 days.  Decreased hearing from the right ear.  On exam, cerumen impaction of the right ear, unable to fully clear it with lighted  curette however patient reports improvement after about 50% of the earwax was removed.  Left ear with small cerumen amount, which was removed, and revealed TM which is bulging, erythematous, and has a suppurative effusion.  Will treat for acute otitis media, unable to visualize right TM but he could potentially also have an otitis media on that side.  Will give Debrox drops to use in both ears as well.  Advised Tylenol and ibuprofen, follow-up with PCP in 1 week.  Of note, his blood pressure was elevated, similar to prior, he is not on any blood pressure medications.  Advised  keeping a log of blood pressures at home, DASH diet, follow-up with PCP. I explained the diagnosis and have given explicit precautions to return to the ER including for any other new or worsening symptoms. The patient understands and accepts the medical plan as it's been dictated and I have answered their questions. Discharge instructions concerning home care and prescriptions have been given. The patient is STABLE and is discharged to home in good condition.    Final Clinical Impressions(s) / ED Diagnoses   Final diagnoses:  Acute suppurative otitis media of left ear without spontaneous rupture of tympanic membrane, recurrence not specified  Impacted cerumen of right ear  Essential hypertension    ED Discharge Orders         Ordered    carbamide peroxide (DEBROX) 6.5 % OTIC solution  2 times daily     10/19/18 1509    amoxicillin-clavulanate (AUGMENTIN) 875-125 MG tablet  2 times daily     10/19/18 [address]1509           Solangel Mcmanaway, BroadwellMercedes, [place] JerseyPA-C 10/19/18 1514    Tilden Fossaees, Elizabeth, MD 10/20/18 [phone]

## 2018-11-30 ENCOUNTER — Emergency Department (HOSPITAL_COMMUNITY)
Admission: EM | Admit: 2018-11-30 | Discharge: 2018-11-30 | Disposition: A | Discharge status: Home / Self Care | Payer: Medicaid Other | Attending: Emergency Medicine | Admitting: Emergency Medicine

## 2018-11-30 ENCOUNTER — Other Ambulatory Visit: Payer: Self-pay

## 2018-11-30 DIAGNOSIS — R69 Illness, unspecified: Secondary | ICD-10-CM

## 2018-11-30 DIAGNOSIS — I10 Essential (primary) hypertension: Secondary | ICD-10-CM | POA: Insufficient documentation

## 2018-11-30 DIAGNOSIS — F17210 Nicotine dependence, cigarettes, uncomplicated: Secondary | ICD-10-CM | POA: Insufficient documentation

## 2018-11-30 DIAGNOSIS — J111 Influenza due to unidentified influenza virus with other respiratory manifestations: Secondary | ICD-10-CM

## 2018-11-30 DIAGNOSIS — Z79899 Other long term (current) drug therapy: Secondary | ICD-10-CM | POA: Insufficient documentation

## 2018-11-30 DIAGNOSIS — J101 Influenza due to other identified influenza virus with other respiratory manifestations: Secondary | ICD-10-CM | POA: Insufficient documentation

## 2018-11-30 MED ORDER — IBUPROFEN 400 MG PO TABS
600.0000 mg | ORAL_TABLET | Freq: Once | ORAL | Status: AC
  Administered 2018-11-30: 600 mg via ORAL
  Filled 2018-11-30: qty 1

## 2018-11-30 NOTE — ED Provider Notes (Signed)
[facility] EMERGENCY DEPARTMENT Provider Note   CSN: [id number] Arrival date & time: 11/30/18  1752     History   Chief Complaint No chief complaint on file.   HPI Cameron Fuentes is a 28 y.o. male.  Patient is a 28 year old male who presents with flulike symptoms.  He states he started about 3 days ago with fever cough congestion and generalized body aches.  He has a bifrontal type headache.  He does feel little short of breath at times but thinks it may be related to his increased congestion.  He has had some fevers up to 104 at home.  He says his father-in-law recently had some cold-like symptoms he is not sure if he got it from him.  He has not taken any medications at home for the symptoms.     Past Medical History:  Diagnosis Date  . Hypertension     There are no active problems to display for this patient.   No past surgical history on file.      Home Medications    Prior to Admission medications   Medication Sig Start Date End Date Taking? Authorizing Provider  amoxicillin-clavulanate (AUGMENTIN) 875-125 MG tablet Take 1 tablet by mouth 2 (two) times daily. One po bid x 7 days 10/19/18   Street, [place], PA-C  cephALEXin (KEFLEX) 500 MG capsule Take 1 capsule (500 mg total) by mouth 3 (three) times daily. 04/03/18   Fayrene Helper, PA-C  traMADol (ULTRAM) 50 MG tablet Take 1 tablet (50 mg total) by mouth every 6 (six) hours as needed for moderate pain or severe pain. 04/03/18   Fayrene Helper, PA-C    Family History No family history on file.  Social History Social History   Tobacco Use  . Smoking status: Current Every Day Smoker    Packs/day: 0.50    Types: Cigarettes  . Smokeless tobacco: Never Used  Substance Use Topics  . Alcohol use: No  . Drug use: No     Allergies   Shrimp [shellfish allergy]   Review of Systems Review of Systems  Constitutional: Positive for chills, fatigue and fever. Negative for diaphoresis.  HENT:  Positive for congestion, rhinorrhea and sore throat. Negative for sneezing.   Eyes: Negative.   Respiratory: Positive for cough and shortness of breath. Negative for chest tightness.   Cardiovascular: Negative for chest pain and leg swelling.  Gastrointestinal: Negative for abdominal pain, blood in stool, diarrhea, nausea and vomiting.  Genitourinary: Negative for difficulty urinating, flank pain, frequency and hematuria.  Musculoskeletal: Positive for myalgias. Negative for arthralgias and back pain.  Skin: Negative for rash.  Neurological: Positive for headaches. Negative for dizziness, speech difficulty, weakness and numbness.     Physical Exam Updated Vital Signs BP 129/88 (BP Location: Right Arm)   Pulse (!) 109   Temp 99.7 F (37.6 C) (Oral)   Resp 16   SpO2 94%   Physical Exam Constitutional:      Appearance: He is well-developed.  HENT:     Head: Normocephalic and atraumatic.     Right Ear: Tympanic membrane normal.     Left Ear: Tympanic membrane normal.     Nose: Rhinorrhea present.     Mouth/Throat:     Mouth: Mucous membranes are moist.     Pharynx: No oropharyngeal exudate or posterior oropharyngeal erythema.  Eyes:     Pupils: Pupils are equal, round, and reactive to light.  Neck:     Musculoskeletal: Normal range of  motion and neck supple.  Cardiovascular:     Rate and Rhythm: Normal rate and regular rhythm.     Heart sounds: Normal heart sounds.  Pulmonary:     Effort: Pulmonary effort is normal. No respiratory distress.     Breath sounds: Normal breath sounds. No wheezing or rales.     Comments: Rhonchi bilaterally Chest:     Chest wall: No tenderness.  Abdominal:     General: Bowel sounds are normal.     Palpations: Abdomen is soft.     Tenderness: There is no abdominal tenderness. There is no guarding or rebound.  Musculoskeletal: Normal range of motion.  Lymphadenopathy:     Cervical: No cervical adenopathy.  Skin:    General: Skin is warm and  dry.     Findings: No rash.  Neurological:     Mental Status: He is alert and oriented to person, place, and time.      ED Treatments / Results  Labs (all labs ordered are listed, but only abnormal results are displayed) Labs Reviewed - No data to display  EKG None  Radiology No results found.  Procedures Procedures (including critical care time)  Medications Ordered in ED Medications  ibuprofen (ADVIL,MOTRIN) tablet 600 mg (600 mg Oral Given 11/30/18 1811)     Initial Impression / Assessment and Plan / ED Course  I have reviewed the triage vital signs and the nursing notes.  Pertinent labs & imaging results that were available during my care of the patient were reviewed by me and considered in my medical decision making (see chart for details).     Patient presents with flulike symptoms.  He has some mild tachycardia but otherwise is well-appearing.  He did report some shortness of breath and given this on the rhonchi that I found on exam I did order a chest x-ray.  He ultimately was waiting for this but then decided he did not want to stay anymore that he has to go pick up his kids.  He was advised in symptomatic care.  He was given return precautions.  Final Clinical Impressions(s) / ED Diagnoses   Final diagnoses:  Influenza-like illness    ED Discharge Orders    None       Rolan Bucco, MD 11/30/18 1842

## 2018-11-30 NOTE — ED Triage Notes (Signed)
Pt to ED for evaluation of flu like symptoms x 3 days. Fever, congestion, sore throat, headache, generalized body aches.

## 2019-10-03 ENCOUNTER — Encounter: Payer: Self-pay | Admitting: Emergency Medicine

## 2019-10-03 ENCOUNTER — Other Ambulatory Visit: Payer: Self-pay

## 2019-10-03 ENCOUNTER — Emergency Department
Admission: EM | Admit: 2019-10-03 | Discharge: 2019-10-03 | Disposition: A | Discharge status: Home / Self Care | Payer: Medicaid Other | Attending: Student in an Organized Health Care Education/Training Program | Admitting: Student in an Organized Health Care Education/Training Program

## 2019-10-03 DIAGNOSIS — R0981 Nasal congestion: Secondary | ICD-10-CM | POA: Insufficient documentation

## 2019-10-03 DIAGNOSIS — I10 Essential (primary) hypertension: Secondary | ICD-10-CM | POA: Insufficient documentation

## 2019-10-03 DIAGNOSIS — Z20828 Contact with and (suspected) exposure to other viral communicable diseases: Secondary | ICD-10-CM | POA: Insufficient documentation

## 2019-10-03 DIAGNOSIS — Z20822 Contact with and (suspected) exposure to covid-19: Secondary | ICD-10-CM

## 2019-10-03 DIAGNOSIS — F17210 Nicotine dependence, cigarettes, uncomplicated: Secondary | ICD-10-CM | POA: Insufficient documentation

## 2019-10-03 LAB — SARS CORONAVIRUS 2 (TAT 6-24 HRS): SARS Coronavirus 2: NEGATIVE

## 2019-10-03 NOTE — ED Notes (Signed)
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 Pt st "runny nose" for 3X. Denies any other symptoms. Pt requesting a COVID test in order to go back to work.

## 2019-10-03 NOTE — ED Triage Notes (Signed)
Runny nose x 3 days, exposed to COVID.  

## 2019-10-03 NOTE — ED Provider Notes (Signed)
[facility] Emergency Department Provider Note  ____________________________________________  Time seen: Approximately 1:27 PM  I have reviewed the triage vital signs and the nursing notes.   HISTORY  Chief Complaint Nasal Congestion    HPI Cameron Fuentes is a 28 y.o. male that presents to the emergency department for a Covid test.  Patient has a coworker that was diagnosed with Covid today.  Patient states that he has had a runny nose for 3 days.  He last saw his coworker on Thursday.  He presents today with his family to be tested.  He needs a Covid test in order to return to work.  He does not feel sick.  He denies any fever, cough, shortness of breath.   Past Medical History:  Diagnosis Date  . Hypertension     There are no problems to display for this patient.   History reviewed. No pertinent surgical history.  Prior to Admission medications   Medication Sig Start Date End Date Taking? Authorizing Provider  amoxicillin-clavulanate (AUGMENTIN) 875-125 MG tablet Take 1 tablet by mouth 2 (two) times daily. One po bid x 7 days 10/19/18   Street, [place], PA-C  cephALEXin (KEFLEX) 500 MG capsule Take 1 capsule (500 mg total) by mouth 3 (three) times daily. 04/03/18   Domenic Moras, PA-C  traMADol (ULTRAM) 50 MG tablet Take 1 tablet (50 mg total) by mouth every 6 (six) hours as needed for moderate pain or severe pain. 04/03/18   Domenic Moras, PA-C    Allergies Shrimp [shellfish allergy]  No family history on file.  Social History Social History   Tobacco Use  . Smoking status: Current Every Day Smoker    Packs/day: 0.50    Types: Cigarettes  . Smokeless tobacco: Never Used  Substance Use Topics  . Alcohol use: No  . Drug use: No     Review of Systems  Constitutional: No fever/chills Eyes: No visual changes. No discharge. ENT: Positive for congestion and rhinorrhea. Cardiovascular: No chest pain. Respiratory: Negative for cough. No  SOB. Gastrointestinal: No abdominal pain.  No nausea, no vomiting.  No diarrhea.  No constipation. Musculoskeletal: Negative for musculoskeletal pain. Skin: Negative for rash, abrasions, lacerations, ecchymosis. Neurological: Negative for headaches.   ____________________________________________   PHYSICAL EXAM:  VITAL SIGNS: ED Triage Vitals  Enc Vitals Group     BP 10/03/19 1233 (!) 154/98     Pulse Rate 10/03/19 1233 70     Resp 10/03/19 1233 20     Temp 10/03/19 1233 98 F (36.7 C)     Temp Source 10/03/19 1233 Oral     SpO2 10/03/19 1233 98 %     Weight 10/03/19 1234 180 lb (81.6 kg)     Height 10/03/19 1234 5\' 6"  (1.676 m)     Head Circumference --      Peak Flow --      Pain Score 10/03/19 1234 0     Pain Loc --      Pain Edu? --      Excl. in [place]? --      Constitutional: Alert and oriented. Well appearing and in no acute distress. Eyes: Conjunctivae are normal. PERRL. EOMI. No discharge. Head: Atraumatic. ENT: No frontal and maxillary sinus tenderness.      Ears: Tympanic membranes pearly gray with good landmarks. No discharge.      Nose: No congestion/rhinnorhea.      Mouth/Throat: Mucous membranes are moist. Oropharynx non-erythematous. Tonsils not enlarged. No exudates. Uvula midline.  Neck: No stridor.   Hematological/Lymphatic/Immunilogical: No cervical lymphadenopathy. Cardiovascular: Normal rate, regular rhythm.  Good peripheral circulation. Respiratory: Normal respiratory effort without tachypnea or retractions. Lungs CTAB. Good air entry to the bases with no decreased or absent breath sounds. Gastrointestinal: Soft and nontender to palpation. No guarding or rigidity. No palpable masses. No distention. Musculoskeletal: Full range of motion to all extremities. No gross deformities appreciated. Neurologic:  Normal speech and language. No gross focal neurologic deficits are appreciated.  Skin:  Skin is warm, dry and intact. No rash noted. Psychiatric: Mood  and affect are normal. Speech and behavior are normal. Patient exhibits appropriate insight and judgement.   ____________________________________________   LABS (all labs ordered are listed, but only abnormal results are displayed)  Labs Reviewed  SARS CORONAVIRUS 2 (TAT 6-24 HRS)   ____________________________________________  EKG   ____________________________________________  RADIOLOGY   No results found.  ____________________________________________    PROCEDURES  Procedure(s) performed:    Procedures    Medications - No data to display   ____________________________________________   INITIAL IMPRESSION / ASSESSMENT AND PLAN / ED COURSE  Pertinent labs & imaging results that were available during my care of the patient were reviewed by me and considered in my medical decision making (see chart for details).  Review of the [place] CSRS was performed in accordance of the [facility] prior to dispensing any controlled drugs.   Patient presented to emergency department for a Covid test. Vital signs and exam are reassuring.  Covid test is pending.  Patient appears well and is staying well hydrated. Patient feels comfortable going home.  Patient is to follow up with primary care as needed or otherwise directed. Patient is given ED precautions to return to the ED for any worsening or new symptoms.  Cameron Fuentes was evaluated in Emergency Department on 10/03/2019 for the symptoms described in the history of present illness. He was evaluated in the context of the global COVID-19 pandemic, which necessitated consideration that the patient might be at risk for infection with the SARS-CoV-2 virus that causes COVID-19. Institutional protocols and algorithms that pertain to the evaluation of patients at risk for COVID-19 are in a state of rapid change based on information released by regulatory bodies including the CDC and federal and state organizations. These policies and algorithms  were followed during the patient's care in the ED.   ____________________________________________  FINAL CLINICAL IMPRESSION(S) / ED DIAGNOSES  Final diagnoses:  Exposure to COVID-19 virus      NEW MEDICATIONS STARTED DURING THIS VISIT:  ED Discharge Orders    None          This chart was dictated using voice recognition software/Dragon. Despite best efforts to proofread, errors can occur which can change the meaning. Any change was purely unintentional.    Enid Derry, PA-C 10/03/19 1843    Willy Eddy, MD 10/06/19 [phone]

## 2020-04-14 DIAGNOSIS — Z419 Encounter for procedure for purposes other than remedying health state, unspecified: Secondary | ICD-10-CM | POA: Diagnosis not present

## 2020-05-15 DIAGNOSIS — Z419 Encounter for procedure for purposes other than remedying health state, unspecified: Secondary | ICD-10-CM | POA: Diagnosis not present

## 2020-06-15 DIAGNOSIS — Z419 Encounter for procedure for purposes other than remedying health state, unspecified: Secondary | ICD-10-CM | POA: Diagnosis not present

## 2020-07-15 DIAGNOSIS — Z419 Encounter for procedure for purposes other than remedying health state, unspecified: Secondary | ICD-10-CM | POA: Diagnosis not present

## 2020-08-15 DIAGNOSIS — Z419 Encounter for procedure for purposes other than remedying health state, unspecified: Secondary | ICD-10-CM | POA: Diagnosis not present

## 2020-09-14 DIAGNOSIS — Z419 Encounter for procedure for purposes other than remedying health state, unspecified: Secondary | ICD-10-CM | POA: Diagnosis not present

## 2020-10-15 DIAGNOSIS — Z419 Encounter for procedure for purposes other than remedying health state, unspecified: Secondary | ICD-10-CM | POA: Diagnosis not present

## 2020-11-15 DIAGNOSIS — Z419 Encounter for procedure for purposes other than remedying health state, unspecified: Secondary | ICD-10-CM | POA: Diagnosis not present

## 2020-12-13 DIAGNOSIS — Z419 Encounter for procedure for purposes other than remedying health state, unspecified: Secondary | ICD-10-CM | POA: Diagnosis not present

## 2021-01-13 DIAGNOSIS — Z419 Encounter for procedure for purposes other than remedying health state, unspecified: Secondary | ICD-10-CM | POA: Diagnosis not present

## 2021-02-12 DIAGNOSIS — Z419 Encounter for procedure for purposes other than remedying health state, unspecified: Secondary | ICD-10-CM | POA: Diagnosis not present

## 2021-03-15 DIAGNOSIS — Z419 Encounter for procedure for purposes other than remedying health state, unspecified: Secondary | ICD-10-CM | POA: Diagnosis not present

## 2021-04-14 DIAGNOSIS — Z419 Encounter for procedure for purposes other than remedying health state, unspecified: Secondary | ICD-10-CM | POA: Diagnosis not present

## 2021-05-15 DIAGNOSIS — Z419 Encounter for procedure for purposes other than remedying health state, unspecified: Secondary | ICD-10-CM | POA: Diagnosis not present

## 2021-06-15 DIAGNOSIS — Z419 Encounter for procedure for purposes other than remedying health state, unspecified: Secondary | ICD-10-CM | POA: Diagnosis not present

## 2021-07-15 DIAGNOSIS — Z419 Encounter for procedure for purposes other than remedying health state, unspecified: Secondary | ICD-10-CM | POA: Diagnosis not present

## 2021-08-15 DIAGNOSIS — Z419 Encounter for procedure for purposes other than remedying health state, unspecified: Secondary | ICD-10-CM | POA: Diagnosis not present

## 2021-09-14 DIAGNOSIS — Z419 Encounter for procedure for purposes other than remedying health state, unspecified: Secondary | ICD-10-CM | POA: Diagnosis not present

## 2021-10-15 DIAGNOSIS — Z419 Encounter for procedure for purposes other than remedying health state, unspecified: Secondary | ICD-10-CM | POA: Diagnosis not present

## 2021-11-15 DIAGNOSIS — Z419 Encounter for procedure for purposes other than remedying health state, unspecified: Secondary | ICD-10-CM | POA: Diagnosis not present

## 2021-12-13 DIAGNOSIS — Z419 Encounter for procedure for purposes other than remedying health state, unspecified: Secondary | ICD-10-CM | POA: Diagnosis not present

## 2022-01-13 DIAGNOSIS — Z419 Encounter for procedure for purposes other than remedying health state, unspecified: Secondary | ICD-10-CM | POA: Diagnosis not present

## 2022-02-12 DIAGNOSIS — Z419 Encounter for procedure for purposes other than remedying health state, unspecified: Secondary | ICD-10-CM | POA: Diagnosis not present

## 2022-03-15 DIAGNOSIS — Z419 Encounter for procedure for purposes other than remedying health state, unspecified: Secondary | ICD-10-CM | POA: Diagnosis not present

## 2022-04-14 DIAGNOSIS — Z419 Encounter for procedure for purposes other than remedying health state, unspecified: Secondary | ICD-10-CM | POA: Diagnosis not present

## 2022-05-15 DIAGNOSIS — Z419 Encounter for procedure for purposes other than remedying health state, unspecified: Secondary | ICD-10-CM | POA: Diagnosis not present

## 2022-06-15 DIAGNOSIS — Z419 Encounter for procedure for purposes other than remedying health state, unspecified: Secondary | ICD-10-CM | POA: Diagnosis not present

## 2022-07-15 DIAGNOSIS — Z419 Encounter for procedure for purposes other than remedying health state, unspecified: Secondary | ICD-10-CM | POA: Diagnosis not present

## 2022-08-15 DIAGNOSIS — Z419 Encounter for procedure for purposes other than remedying health state, unspecified: Secondary | ICD-10-CM | POA: Diagnosis not present

## 2022-11-15 DIAGNOSIS — Z419 Encounter for procedure for purposes other than remedying health state, unspecified: Secondary | ICD-10-CM | POA: Diagnosis not present

## 2022-12-14 DIAGNOSIS — Z419 Encounter for procedure for purposes other than remedying health state, unspecified: Secondary | ICD-10-CM | POA: Diagnosis not present

## 2023-01-14 DIAGNOSIS — Z419 Encounter for procedure for purposes other than remedying health state, unspecified: Secondary | ICD-10-CM | POA: Diagnosis not present

## 2023-02-13 DIAGNOSIS — Z419 Encounter for procedure for purposes other than remedying health state, unspecified: Secondary | ICD-10-CM | POA: Diagnosis not present

## 2023-03-16 DIAGNOSIS — Z419 Encounter for procedure for purposes other than remedying health state, unspecified: Secondary | ICD-10-CM | POA: Diagnosis not present

## 2023-04-15 DIAGNOSIS — Z419 Encounter for procedure for purposes other than remedying health state, unspecified: Secondary | ICD-10-CM | POA: Diagnosis not present

## 2023-05-16 DIAGNOSIS — Z419 Encounter for procedure for purposes other than remedying health state, unspecified: Secondary | ICD-10-CM | POA: Diagnosis not present

## 2023-06-16 DIAGNOSIS — Z419 Encounter for procedure for purposes other than remedying health state, unspecified: Secondary | ICD-10-CM | POA: Diagnosis not present

## 2023-07-16 DIAGNOSIS — Z419 Encounter for procedure for purposes other than remedying health state, unspecified: Secondary | ICD-10-CM | POA: Diagnosis not present

## 2023-08-16 DIAGNOSIS — Z419 Encounter for procedure for purposes other than remedying health state, unspecified: Secondary | ICD-10-CM | POA: Diagnosis not present

## 2023-09-15 DIAGNOSIS — Z419 Encounter for procedure for purposes other than remedying health state, unspecified: Secondary | ICD-10-CM | POA: Diagnosis not present

## 2023-10-16 DIAGNOSIS — Z419 Encounter for procedure for purposes other than remedying health state, unspecified: Secondary | ICD-10-CM | POA: Diagnosis not present

## 2023-11-16 DIAGNOSIS — Z419 Encounter for procedure for purposes other than remedying health state, unspecified: Secondary | ICD-10-CM | POA: Diagnosis not present

## 2023-12-14 DIAGNOSIS — Z419 Encounter for procedure for purposes other than remedying health state, unspecified: Secondary | ICD-10-CM | POA: Diagnosis not present

## 2024-01-25 DIAGNOSIS — Z419 Encounter for procedure for purposes other than remedying health state, unspecified: Secondary | ICD-10-CM | POA: Diagnosis not present

## 2024-02-24 DIAGNOSIS — Z419 Encounter for procedure for purposes other than remedying health state, unspecified: Secondary | ICD-10-CM | POA: Diagnosis not present

## 2024-03-26 DIAGNOSIS — Z419 Encounter for procedure for purposes other than remedying health state, unspecified: Secondary | ICD-10-CM | POA: Diagnosis not present

## 2024-04-25 DIAGNOSIS — Z419 Encounter for procedure for purposes other than remedying health state, unspecified: Secondary | ICD-10-CM | POA: Diagnosis not present

## 2024-05-01 ENCOUNTER — Encounter: Payer: Self-pay | Admitting: Advanced Practice Midwife

## 2024-05-26 DIAGNOSIS — Z419 Encounter for procedure for purposes other than remedying health state, unspecified: Secondary | ICD-10-CM | POA: Diagnosis not present

## 2024-06-26 DIAGNOSIS — Z419 Encounter for procedure for purposes other than remedying health state, unspecified: Secondary | ICD-10-CM | POA: Diagnosis not present
